# Patient Record
Sex: Female | Born: 1950 | Race: White | Hispanic: No | Smoking: Former smoker
Health system: Southern US, Community
[De-identification: ages and names within clinical notes are randomized; demographics above are authoritative.]

## PROBLEM LIST (undated history)

## (undated) DIAGNOSIS — C801 Malignant (primary) neoplasm, unspecified: Secondary | ICD-10-CM

## (undated) DIAGNOSIS — G2581 Restless legs syndrome: Secondary | ICD-10-CM

## (undated) DIAGNOSIS — T4145XA Adverse effect of unspecified anesthetic, initial encounter: Secondary | ICD-10-CM

## (undated) DIAGNOSIS — F32A Depression, unspecified: Secondary | ICD-10-CM

## (undated) DIAGNOSIS — J45909 Unspecified asthma, uncomplicated: Secondary | ICD-10-CM

## (undated) DIAGNOSIS — D649 Anemia, unspecified: Secondary | ICD-10-CM

## (undated) DIAGNOSIS — I10 Essential (primary) hypertension: Secondary | ICD-10-CM

## (undated) DIAGNOSIS — F419 Anxiety disorder, unspecified: Secondary | ICD-10-CM

## (undated) DIAGNOSIS — F329 Major depressive disorder, single episode, unspecified: Secondary | ICD-10-CM

## (undated) DIAGNOSIS — Z8781 Personal history of (healed) traumatic fracture: Secondary | ICD-10-CM

## (undated) DIAGNOSIS — K219 Gastro-esophageal reflux disease without esophagitis: Secondary | ICD-10-CM

## (undated) DIAGNOSIS — I499 Cardiac arrhythmia, unspecified: Secondary | ICD-10-CM

## (undated) DIAGNOSIS — J449 Chronic obstructive pulmonary disease, unspecified: Secondary | ICD-10-CM

## (undated) DIAGNOSIS — R0600 Dyspnea, unspecified: Secondary | ICD-10-CM

## (undated) DIAGNOSIS — E119 Type 2 diabetes mellitus without complications: Secondary | ICD-10-CM

## (undated) HISTORY — PX: COLONOSCOPY: SHX174

## (undated) HISTORY — DX: Unspecified asthma, uncomplicated: J45.909

## (undated) HISTORY — PX: NOSE SURGERY: SHX723

---
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
## 1975-08-04 HISTORY — PX: TUBAL LIGATION: SHX77

## 1988-08-03 DIAGNOSIS — T8859XA Other complications of anesthesia, initial encounter: Secondary | ICD-10-CM

## 1988-08-03 HISTORY — PX: BUNIONECTOMY: SHX129

## 1988-08-03 HISTORY — DX: Other complications of anesthesia, initial encounter: T88.59XA

## 2004-06-28 ENCOUNTER — Other Ambulatory Visit: Payer: Self-pay

## 2004-06-28 ENCOUNTER — Emergency Department: Payer: Self-pay | Admitting: Internal Medicine

## 2010-05-04 ENCOUNTER — Inpatient Hospital Stay: Payer: Self-pay | Admitting: Internal Medicine

## 2010-12-04 ENCOUNTER — Emergency Department: Payer: Self-pay | Admitting: Emergency Medicine

## 2011-04-26 DIAGNOSIS — Z720 Tobacco use: Secondary | ICD-10-CM | POA: Insufficient documentation

## 2011-04-26 DIAGNOSIS — J9600 Acute respiratory failure, unspecified whether with hypoxia or hypercapnia: Secondary | ICD-10-CM | POA: Insufficient documentation

## 2011-04-26 DIAGNOSIS — J441 Chronic obstructive pulmonary disease with (acute) exacerbation: Secondary | ICD-10-CM | POA: Insufficient documentation

## 2016-12-31 DIAGNOSIS — J449 Chronic obstructive pulmonary disease, unspecified: Secondary | ICD-10-CM | POA: Insufficient documentation

## 2016-12-31 DIAGNOSIS — Z87891 Personal history of nicotine dependence: Secondary | ICD-10-CM | POA: Insufficient documentation

## 2016-12-31 DIAGNOSIS — G2581 Restless legs syndrome: Secondary | ICD-10-CM | POA: Insufficient documentation

## 2017-01-01 ENCOUNTER — Other Ambulatory Visit: Payer: Self-pay | Admitting: Internal Medicine

## 2017-01-01 DIAGNOSIS — Z1231 Encounter for screening mammogram for malignant neoplasm of breast: Secondary | ICD-10-CM

## 2017-01-20 ENCOUNTER — Encounter (HOSPITAL_COMMUNITY): Payer: Self-pay

## 2017-01-20 ENCOUNTER — Ambulatory Visit
Admission: RE | Admit: 2017-01-20 | Discharge: 2017-01-20 | Disposition: A | Discharge status: Home / Self Care | Payer: BLUE CROSS/BLUE SHIELD | Source: Ambulatory Visit | Attending: Internal Medicine | Admitting: Internal Medicine

## 2017-01-20 DIAGNOSIS — Z1231 Encounter for screening mammogram for malignant neoplasm of breast: Secondary | ICD-10-CM | POA: Diagnosis not present

## 2017-01-20 HISTORY — DX: Malignant (primary) neoplasm, unspecified: C80.1

## 2017-01-27 ENCOUNTER — Inpatient Hospital Stay
Admission: RE | Admit: 2017-01-27 | Discharge: 2017-01-27 | Disposition: A | Discharge status: Home / Self Care | Payer: Self-pay | Source: Ambulatory Visit | Attending: *Deleted | Admitting: *Deleted

## 2017-01-27 ENCOUNTER — Other Ambulatory Visit: Payer: Self-pay | Admitting: *Deleted

## 2017-01-27 DIAGNOSIS — Z9289 Personal history of other medical treatment: Secondary | ICD-10-CM

## 2017-05-03 HISTORY — PX: YAG LASER APPLICATION: SHX6189

## 2017-08-03 ENCOUNTER — Emergency Department
Admission: EM | Admit: 2017-08-03 | Discharge: 2017-08-03 | Disposition: A | Discharge status: Home / Self Care | Payer: BLUE CROSS/BLUE SHIELD | Attending: Emergency Medicine | Admitting: Emergency Medicine

## 2017-08-03 ENCOUNTER — Emergency Department: Payer: BLUE CROSS/BLUE SHIELD

## 2017-08-03 ENCOUNTER — Other Ambulatory Visit: Payer: Self-pay

## 2017-08-03 ENCOUNTER — Encounter: Payer: Self-pay | Admitting: Emergency Medicine

## 2017-08-03 DIAGNOSIS — S52122A Displaced fracture of head of left radius, initial encounter for closed fracture: Secondary | ICD-10-CM | POA: Diagnosis not present

## 2017-08-03 DIAGNOSIS — W010XXA Fall on same level from slipping, tripping and stumbling without subsequent striking against object, initial encounter: Secondary | ICD-10-CM | POA: Diagnosis not present

## 2017-08-03 DIAGNOSIS — S4992XA Unspecified injury of left shoulder and upper arm, initial encounter: Secondary | ICD-10-CM | POA: Diagnosis present

## 2017-08-03 DIAGNOSIS — Y9341 Activity, dancing: Secondary | ICD-10-CM | POA: Diagnosis not present

## 2017-08-03 DIAGNOSIS — Y999 Unspecified external cause status: Secondary | ICD-10-CM | POA: Insufficient documentation

## 2017-08-03 DIAGNOSIS — Z87891 Personal history of nicotine dependence: Secondary | ICD-10-CM | POA: Diagnosis not present

## 2017-08-03 DIAGNOSIS — Y929 Unspecified place or not applicable: Secondary | ICD-10-CM | POA: Insufficient documentation

## 2017-08-03 MED ORDER — ONDANSETRON 8 MG PO TBDP
8.0000 mg | ORAL_TABLET | Freq: Once | ORAL | Status: AC
  Administered 2017-08-03: 8 mg via ORAL
  Filled 2017-08-03: qty 1

## 2017-08-03 MED ORDER — OXYCODONE-ACETAMINOPHEN 5-325 MG PO TABS
1.0000 | ORAL_TABLET | Freq: Once | ORAL | Status: AC
  Administered 2017-08-03: 1 via ORAL
  Filled 2017-08-03: qty 1

## 2017-08-03 MED ORDER — OXYCODONE-ACETAMINOPHEN 5-325 MG PO TABS: 1.0000 | ORAL_TABLET | Freq: Four times a day (QID) | ORAL | 0 refills | Status: DC | PRN

## 2017-08-03 NOTE — Discharge Instructions (Signed)
Keep arm in sling until evaluation by orthopedics clinic. Call tomorrow morning at 0 8:30 to schedule appointment.

## 2017-08-03 NOTE — Consult Note (Signed)
ORTHOPAEDIC CONSULTATION  REQUESTING PHYSICIAN: Mardee Postin, PA  Chief Complaint:   L elbow pain  History of Present Illness: Tanya Ryan is a 67 y.o. female with left elbow pain after fall yesterday while dancing. Patient stated pain increases with elbow extension. Pain improved with immobilization. She rates the pain as a 10 over 10. Patient described a pain as throbbing. She is left-handed. X-rays in ED show radial head fracture.    Past Medical History:  Diagnosis Date  . Cancer ([place])    skin   History reviewed. No pertinent surgical history. Social History   Socioeconomic History  . Marital status: Divorced    Spouse name: None  . Number of children: None  . Years of education: None  . Highest education level: None  Social Needs  . Financial resource strain: None  . Food insecurity - worry: None  . Food insecurity - inability: None  . Transportation needs - medical: None  . Transportation needs - non-medical: None  Occupational History  . None  Tobacco Use  . Smoking status: Former Research scientist (life sciences)  . Smokeless tobacco: Never Used  Substance and Sexual Activity  . Alcohol use: No    Frequency: Never  . Drug use: None  . Sexual activity: None  Other Topics Concern  . None  Social History Narrative  . None   Family History  Problem Relation Age of Onset  . Breast cancer Neg Hx    No Known Allergies Prior to Admission medications   Medication Sig Start Date End Date Taking? Authorizing Provider  oxyCODONE-acetaminophen (ROXICET) 5-325 MG tablet Take 1 tablet by mouth every 6 (six) hours as needed. 08/03/17 08/03/18  Sable Feil, PA-C   Dg Elbow Complete Left  Result Date: 08/03/2017 CLINICAL DATA:  Recent fall with elbow pain, initial encounter EXAM: LEFT ELBOW - COMPLETE 3+ VIEW COMPARISON:  None. FINDINGS: There is a fracture through the radial head and radial neck with displacement of the articular  surface of the radial head posteriorly. Joint effusion is noted. No other focal abnormality is seen. IMPRESSION: Fracture the proximal radius with displacement of the articular surface of the radial head posteriorly. Electronically Signed   By: Inez Catalina M.D.   On: 08/03/2017 13:33    Positive ROS: All other systems have been reviewed and were otherwise negative with the exception of those mentioned in the HPI and as above.  Physical Exam: BP (!) 167/60 (BP Location: Right Arm)   Pulse 81   Temp 98.6 F (37 C) (Oral)   Resp 16   Ht 5\' 3"  (1.6 m)   Wt 83.9 kg (185 lb)   SpO2 98%   BMI 32.77 kg/m  General:  Alert, no acute distress Psychiatric:  Patient is competent for consent with normal mood and affect   Cardiovascular:  No pedal edema, regular rate and rhythm Respiratory:  No wheezing, non-labored breathing GI:  Abdomen is soft and non-tender Skin:  No lesions in the area of chief complaint, no erythema Neurologic:  Sensation intact distally, CN grossly intact Lymphatic:  No axillary or cervical lymphadenopathy  Orthopedic Exam:  LUE: +ain/pin/u motor Silt r/u/m distr +rad pulse Exquisite tenderness about elbow diffusely Elbow RoM: 20-90 flex/ext, neutral supination, 80 pronation  X-rays:  As above: fracture of articular portion of radial head with posterior displacement of articular fragment  Assessment/Plan: 1. NWB LUE 2. Sling to LUE 3. Discussed her case with my partner, Dr. Roland Rack, who will plan to see her in the  office this week to discuss surgical options.     Leim Fabry   08/03/2017 3:20 PM

## 2017-08-03 NOTE — ED Triage Notes (Signed)
Was dancing last night and someone stepped on her shoe causing her to fall and injure her left arm.

## 2017-08-03 NOTE — ED Notes (Signed)
Pt reports that she was dancing and fell causing injury to left elbow last night - pt reports difficulty moving the arm and elbow

## 2017-08-03 NOTE — ED Provider Notes (Signed)
[facility] Emergency Department Provider Note   ____________________________________________   First MD Initiated Contact with Patient 08/03/17 1310     (approximate)  I have reviewed the triage vital signs and the nursing notes.   HISTORY  Chief Complaint Arm Injury    HPI Tanya Ryan is a 67 y.o. female patient complaining of left elbow pain secondary to a slip and fall yesterday while dancing. He stated pain is completely fine to her left elbow. Patient stated pain increases with extension of the forearm. She rates the pain as a 10 over 10. Patient described a pain as "throbbing". Patient has a make shift slingsupporting his left elbow.   Past Medical History:  Diagnosis Date  . Cancer ([place])    skin    There are no active problems to display for this patient.   History reviewed. No pertinent surgical history.  Prior to Admission medications   Medication Sig Start Date End Date Taking? Authorizing Provider  oxyCODONE-acetaminophen (ROXICET) 5-325 MG tablet Take 1 tablet by mouth every 6 (six) hours as needed. 08/03/17 08/03/18  Sable Feil, PA-C    Allergies Patient has no known allergies.  Family History  Problem Relation Age of Onset  . Breast cancer Neg Hx     Social History Social History   Tobacco Use  . Smoking status: Former Research scientist (life sciences)  . Smokeless tobacco: Never Used  Substance Use Topics  . Alcohol use: No    Frequency: Never  . Drug use: Not on file    Review of Systems Constitutional: No fever/chills Eyes: No visual changes. ENT: No sore throat. Cardiovascular: Denies chest pain. Respiratory: Denies shortness of breath. Gastrointestinal: No abdominal pain.  No nausea, no vomiting.  No diarrhea.  No constipation. Genitourinary: Negative for dysuria. Musculoskeletal: Negative for back pain. Skin: Negative for rash. Neurological: Negative for headaches, focal weakness or  numbness.   ____________________________________________   PHYSICAL EXAM:  VITAL SIGNS: ED Triage Vitals  Enc Vitals Group     BP 08/03/17 1236 (!) 167/60     Pulse Rate 08/03/17 1236 81     Resp 08/03/17 1236 16     Temp 08/03/17 1236 98.6 F (37 C)     Temp Source 08/03/17 1236 Oral     SpO2 08/03/17 1236 98 %     Weight 08/03/17 1236 185 lb (83.9 kg)     Height 08/03/17 1236 5\' 3"  (1.6 m)     Head Circumference --      Peak Flow --      Pain Score 08/03/17 1217 10     Pain Loc --      Pain Edu? --      Excl. in [place]? --    Constitutional: Alert and oriented. Well appearing and in no acute distress. Neck: No stridor. Hematological/Lymphatic/Immunilogical: No cervical lymphadenopathy. Cardiovascular: Normal rate, regular rhythm. Grossly normal heart sounds.  Good peripheral circulation. Elevated blood pressure Respiratory: Normal respiratory effort.  No retractions. Lungs CTAB. Musculoskeletal: No obvious deformity to the left elbow. There is mild effusion. Patient has moderate guarding palpation of the radial head.. Neurologic:  Normal speech and language. No gross focal neurologic deficits are appreciated. No gait instability. Skin:  Skin is warm, dry and intact. No rash noted. Psychiatric: Mood and affect are normal. Speech and behavior are normal.  ____________________________________________   LABS (all labs ordered are listed, but only abnormal results are displayed)  Labs Reviewed - No data to display ____________________________________________  EKG  ____________________________________________  RADIOLOGY  Dg Elbow Complete Left  Result Date: 08/03/2017 CLINICAL DATA:  Recent fall with elbow pain, initial encounter EXAM: LEFT ELBOW - COMPLETE 3+ VIEW COMPARISON:  None. FINDINGS: There is a fracture through the radial head and radial neck with displacement of the articular surface of the radial head posteriorly. Joint effusion is noted. No other focal  abnormality is seen. IMPRESSION: Fracture the proximal radius with displacement of the articular surface of the radial head posteriorly. Electronically Signed   By: Inez Catalina M.D.   On: 08/03/2017 13:33    ____________________________________________   PROCEDURES  Procedure(s) performed: None  Procedures  Critical Care performed: No  ____________________________________________   INITIAL IMPRESSION / ASSESSMENT AND PLAN / ED COURSE  As part of my medical decision making, I reviewed the following data within the electronic MEDICAL RECORD NUMBER    Displaced radial head fracture of the left upper extremity. Discussed patient with Dr. Posey Pronto and patient will follow-up in the orthopedic department by calling for an appointment tomorrow morning. Patient placed and a sling prior to departure. Status post patient of the sling patient remained neurovascularly intact was able to move the wrist and fingers.      ____________________________________________   FINAL CLINICAL IMPRESSION(S) / ED DIAGNOSES  Final diagnoses:  Closed displaced fracture of head of left radius, initial encounter     ED Discharge Orders        Ordered    oxyCODONE-acetaminophen (ROXICET) 5-325 MG tablet  Every 6 hours PRN     08/03/17 1446       Note:  This document was prepared using Dragon voice recognition software and may include unintentional dictation errors.    Sable Feil, PA-C 08/03/17 1450    Schuyler Amor, MD 08/03/17 [phone]

## 2017-08-06 DIAGNOSIS — S52122A Displaced fracture of head of left radius, initial encounter for closed fracture: Secondary | ICD-10-CM | POA: Insufficient documentation

## 2017-08-09 ENCOUNTER — Encounter
Admission: RE | Admit: 2017-08-09 | Discharge: 2017-08-09 | Disposition: A | Discharge status: Home / Self Care | Payer: BLUE CROSS/BLUE SHIELD | Source: Ambulatory Visit | Attending: Surgery | Admitting: Surgery

## 2017-08-09 ENCOUNTER — Other Ambulatory Visit: Payer: Self-pay

## 2017-08-09 DIAGNOSIS — K219 Gastro-esophageal reflux disease without esophagitis: Secondary | ICD-10-CM | POA: Diagnosis not present

## 2017-08-09 DIAGNOSIS — Z7984 Long term (current) use of oral hypoglycemic drugs: Secondary | ICD-10-CM | POA: Diagnosis not present

## 2017-08-09 DIAGNOSIS — E119 Type 2 diabetes mellitus without complications: Secondary | ICD-10-CM | POA: Diagnosis not present

## 2017-08-09 DIAGNOSIS — F411 Generalized anxiety disorder: Secondary | ICD-10-CM | POA: Diagnosis not present

## 2017-08-09 DIAGNOSIS — Y9341 Activity, dancing: Secondary | ICD-10-CM | POA: Diagnosis not present

## 2017-08-09 DIAGNOSIS — Z85828 Personal history of other malignant neoplasm of skin: Secondary | ICD-10-CM | POA: Diagnosis not present

## 2017-08-09 DIAGNOSIS — S52122A Displaced fracture of head of left radius, initial encounter for closed fracture: Secondary | ICD-10-CM | POA: Diagnosis present

## 2017-08-09 DIAGNOSIS — W500XXA Accidental hit or strike by another person, initial encounter: Secondary | ICD-10-CM | POA: Diagnosis not present

## 2017-08-09 DIAGNOSIS — G2581 Restless legs syndrome: Secondary | ICD-10-CM | POA: Diagnosis not present

## 2017-08-09 DIAGNOSIS — Z79899 Other long term (current) drug therapy: Secondary | ICD-10-CM | POA: Diagnosis not present

## 2017-08-09 DIAGNOSIS — Z888 Allergy status to other drugs, medicaments and biological substances status: Secondary | ICD-10-CM | POA: Diagnosis not present

## 2017-08-09 DIAGNOSIS — J449 Chronic obstructive pulmonary disease, unspecified: Secondary | ICD-10-CM | POA: Diagnosis not present

## 2017-08-09 DIAGNOSIS — Y998 Other external cause status: Secondary | ICD-10-CM | POA: Diagnosis not present

## 2017-08-09 HISTORY — DX: Anxiety disorder, unspecified: F41.9

## 2017-08-09 HISTORY — DX: Personal history of (healed) traumatic fracture: Z87.81

## 2017-08-09 HISTORY — DX: Type 2 diabetes mellitus without complications: E11.9

## 2017-08-09 HISTORY — DX: Restless legs syndrome: G25.81

## 2017-08-09 HISTORY — DX: Chronic obstructive pulmonary disease, unspecified: J44.9

## 2017-08-09 HISTORY — DX: Gastro-esophageal reflux disease without esophagitis: K21.9

## 2017-08-09 LAB — CBC
HCT: 40.3 % (ref 35.0–47.0)
Hemoglobin: 13.2 g/dL (ref 12.0–16.0)
MCH: 29 pg (ref 26.0–34.0)
MCHC: 32.8 g/dL (ref 32.0–36.0)
MCV: 88.5 fL (ref 80.0–100.0)
PLATELETS: 281 10*3/uL (ref 150–440)
RBC: 4.55 MIL/uL (ref 3.80–5.20)
RDW: 13.7 % (ref 11.5–14.5)
WBC: 9.7 10*3/uL (ref 3.6–11.0)

## 2017-08-09 LAB — BASIC METABOLIC PANEL
ANION GAP: 9 (ref 5–15)
BUN: 18 mg/dL (ref 6–20)
CO2: 27 mmol/L (ref 22–32)
Calcium: 9.6 mg/dL (ref 8.9–10.3)
Chloride: 105 mmol/L (ref 101–111)
Creatinine, Ser: 0.73 mg/dL (ref 0.44–1.00)
Glucose, Bld: 122 mg/dL — ABNORMAL HIGH (ref 65–99)
Potassium: 3.9 mmol/L (ref 3.5–5.1)
SODIUM: 141 mmol/L (ref 135–145)

## 2017-08-09 NOTE — Patient Instructions (Addendum)
Your procedure is scheduled on: Tuesday, January8, 2019  Report to [facility] AT 1:30 PM  Remember: Instructions that are not followed completely may result in serious medical risk, up to and including death, or upon the discretion of your surgeon and anesthesiologist your surgery may need to be rescheduled.     _X__ 1. Do not eat food after midnight the night before your procedure.                 No gum chewing or hard candies. You may drink clear liquids up to 2 hours                 before you are scheduled to arrive for your surgery- DO not drink clear                 liquids within 2 hours of the start of your surgery.                 Clear Liquids include:  water, apple juice without pulp, clear carbohydrate                 drink such as Clearfast of [place], Black Coffee or Tea (Do not add                 anything to coffee or tea).     _X__ 2.  No Alcohol for 24 hours before or after surgery.   _X__ 3.  Do Not Smoke or use e-cigarettes For 24 Hours Prior to Your Surgery.                 Do not use any chewable tobacco products for at least 6 hours prior to                 surgery.  ____  4.  Bring all medications with you on the day of surgery if instructed.   ____  5.  Notify your doctor if there is any change in your medical condition      (cold, fever, infections).     Do not wear jewelry, make-up, hairpins, clips or nail polish. Do not wear lotions, powders, or perfumes. You may wear deodorant. Do not shave 48 hours prior to surgery. Men may shave face and neck. Do not bring valuables to the hospital.    [facility] is not responsible for any belongings or valuables.  Contacts, dentures or bridgework may not be worn into surgery. Leave your suitcase in the car. After surgery it may be brought to your room. For patients admitted to the hospital, discharge time is determined by your treatment team.   Patients  discharged the day of surgery will not be allowed to drive home.   Please read over the following fact sheets that you were given:   PREPARING FOR SURGERY    ____ Take these medicines the morning of surgery with A SIP OF WATER:    1. ADVAIR  2.  XANAX  3.  PERCOCET, IF NEED  4.  ZOLOFT  5.  PRED FORTE DROPS  6.  CAN USE CLOBEX SHAMPOO             7.  PRILOSEC             8. LORATADINE  ____ Fleet Enema (as directed)   __X__ Use CHG Soap as directed  __X_ Use inhalers on the day of surgery  ____ Stop metformin 2 days  prior to surgery    ____ Take 1/2 of usual insulin dose the night before surgery. No insulin the morning          of surgery.   __X__ Stop ASPIRIN AS OF TODAY   __X__ Stop ANTIINFLAMMATORIES AS OF TODAY. THIS INCLUDES IBUPROFEN / MOTRIN / ADVIL / ALEVE / GOODIES POWDER   __X__ Stop supplements until after surgery.    ____ Bring C-Pap to the hospital.   DO NOT TAKE JANUVIA OR PHENTERMINE IN THE MORNING BEFORE SURGERY.  DO NOT TAKE ANY FISH OIL OR SUPPLEMENTS IN THE MORNING.  YOU MAY DRINK CLEAR LIQUIDS UP UNTIL 11:30 AM

## 2017-08-09 NOTE — Pre-Procedure Instructions (Signed)
Component Name Value Ref Range  Ventricular Rate 117 BPM  Atrial Rate 117 BPM  P-R Interval 138 ms  QRS Duration 136 ms  Q-T Interval 374 ms  QTC Calculation (Bezet) 521 ms  Calculated P Axis 73 degrees  Calculated R Axis 1 degrees  Calculated T Axis 107 degrees  Diagnosis Sinus tachycardia Left bundle branch block Confirmed by Graylon Gunning [id number]) on 06/03/2011 9:58:30 PM    Status Results Details   Encounter Summary    Current EKG with Left Bundle Branch block(not confirmed as of now). No other recent EKGs' to compare to EKG from 08/09/2017

## 2017-08-10 ENCOUNTER — Ambulatory Visit: Payer: BLUE CROSS/BLUE SHIELD | Admitting: Anesthesiology

## 2017-08-10 ENCOUNTER — Encounter
Admission: RE | Disposition: A | Discharge status: Home / Self Care | Payer: Self-pay | Source: Ambulatory Visit | Attending: Surgery

## 2017-08-10 ENCOUNTER — Ambulatory Visit
Admission: RE | Admit: 2017-08-10 | Discharge: 2017-08-10 | Disposition: A | Discharge status: Home / Self Care | Payer: BLUE CROSS/BLUE SHIELD | Source: Ambulatory Visit | Attending: Surgery | Admitting: Surgery

## 2017-08-10 DIAGNOSIS — Z888 Allergy status to other drugs, medicaments and biological substances status: Secondary | ICD-10-CM | POA: Insufficient documentation

## 2017-08-10 DIAGNOSIS — S52122A Displaced fracture of head of left radius, initial encounter for closed fracture: Secondary | ICD-10-CM | POA: Insufficient documentation

## 2017-08-10 DIAGNOSIS — E119 Type 2 diabetes mellitus without complications: Secondary | ICD-10-CM | POA: Insufficient documentation

## 2017-08-10 DIAGNOSIS — Z85828 Personal history of other malignant neoplasm of skin: Secondary | ICD-10-CM | POA: Insufficient documentation

## 2017-08-10 DIAGNOSIS — F411 Generalized anxiety disorder: Secondary | ICD-10-CM | POA: Insufficient documentation

## 2017-08-10 DIAGNOSIS — Z79899 Other long term (current) drug therapy: Secondary | ICD-10-CM | POA: Insufficient documentation

## 2017-08-10 DIAGNOSIS — W500XXA Accidental hit or strike by another person, initial encounter: Secondary | ICD-10-CM | POA: Insufficient documentation

## 2017-08-10 DIAGNOSIS — Z7984 Long term (current) use of oral hypoglycemic drugs: Secondary | ICD-10-CM | POA: Insufficient documentation

## 2017-08-10 DIAGNOSIS — Y9341 Activity, dancing: Secondary | ICD-10-CM | POA: Insufficient documentation

## 2017-08-10 DIAGNOSIS — J449 Chronic obstructive pulmonary disease, unspecified: Secondary | ICD-10-CM | POA: Insufficient documentation

## 2017-08-10 DIAGNOSIS — Y998 Other external cause status: Secondary | ICD-10-CM | POA: Insufficient documentation

## 2017-08-10 DIAGNOSIS — G2581 Restless legs syndrome: Secondary | ICD-10-CM | POA: Insufficient documentation

## 2017-08-10 DIAGNOSIS — K219 Gastro-esophageal reflux disease without esophagitis: Secondary | ICD-10-CM | POA: Insufficient documentation

## 2017-08-10 HISTORY — PX: RADIAL HEAD ARTHROPLASTY: SHX6044

## 2017-08-10 LAB — GLUCOSE, CAPILLARY
Glucose-Capillary: 115 mg/dL — ABNORMAL HIGH (ref 65–99)
Glucose-Capillary: 169 mg/dL — ABNORMAL HIGH (ref 65–99)

## 2017-08-10 SURGERY — ARTHROPLASTY, RADIUS, HEAD
Anesthesia: General | Site: Elbow | Laterality: Left | Wound class: Clean

## 2017-08-10 MED ORDER — FENTANYL CITRATE (PF) 100 MCG/2ML IJ SOLN
INTRAMUSCULAR | Status: AC
  Filled 2017-08-10: qty 2

## 2017-08-10 MED ORDER — CEFAZOLIN SODIUM-DEXTROSE 2-4 GM/100ML-% IV SOLN
INTRAVENOUS | Status: AC
  Filled 2017-08-10: qty 100

## 2017-08-10 MED ORDER — MIDAZOLAM HCL 2 MG/2ML IJ SOLN
INTRAMUSCULAR | Status: AC
  Filled 2017-08-10: qty 2

## 2017-08-10 MED ORDER — CEFAZOLIN SODIUM-DEXTROSE 2-4 GM/100ML-% IV SOLN
2.0000 g | Freq: Once | INTRAVENOUS | Status: AC
  Administered 2017-08-10: 2 g via INTRAVENOUS

## 2017-08-10 MED ORDER — BUPIVACAINE HCL (PF) 0.5 % IJ SOLN
INTRAMUSCULAR | Status: AC
  Filled 2017-08-10: qty 30

## 2017-08-10 MED ORDER — ONDANSETRON HCL 4 MG/2ML IJ SOLN
INTRAMUSCULAR | Status: AC
  Filled 2017-08-10: qty 2

## 2017-08-10 MED ORDER — ACETAMINOPHEN 10 MG/ML IV SOLN
INTRAVENOUS | Status: AC
  Filled 2017-08-10: qty 100

## 2017-08-10 MED ORDER — ONDANSETRON HCL 4 MG PO TABS: 4.0000 mg | ORAL_TABLET | Freq: Four times a day (QID) | ORAL | Status: DC | PRN

## 2017-08-10 MED ORDER — LIDOCAINE HCL (PF) 2 % IJ SOLN
INTRAMUSCULAR | Status: AC
  Filled 2017-08-10: qty 10

## 2017-08-10 MED ORDER — PROPOFOL 10 MG/ML IV BOLUS
INTRAVENOUS | Status: DC | PRN
  Administered 2017-08-10: 150 mg via INTRAVENOUS

## 2017-08-10 MED ORDER — OXYCODONE HCL 5 MG PO TABS: 5.0000 mg | ORAL_TABLET | ORAL | 0 refills | Status: DC | PRN

## 2017-08-10 MED ORDER — METOCLOPRAMIDE HCL 10 MG PO TABS: 5.0000 mg | ORAL_TABLET | Freq: Three times a day (TID) | ORAL | Status: DC | PRN

## 2017-08-10 MED ORDER — FENTANYL CITRATE (PF) 100 MCG/2ML IJ SOLN
INTRAMUSCULAR | Status: DC | PRN
  Administered 2017-08-10 (×4): 50 ug via INTRAVENOUS

## 2017-08-10 MED ORDER — SODIUM CHLORIDE 0.9 % IV SOLN
INTRAVENOUS | Status: DC
  Administered 2017-08-10: 14:00:00 via INTRAVENOUS

## 2017-08-10 MED ORDER — OXYCODONE HCL 5 MG PO TABS: 5.0000 mg | ORAL_TABLET | ORAL | Status: DC | PRN

## 2017-08-10 MED ORDER — ONDANSETRON HCL 4 MG/2ML IJ SOLN
4.0000 mg | Freq: Once | INTRAMUSCULAR | Status: DC | PRN
Start: 2017-08-10 — End: 2017-08-10

## 2017-08-10 MED ORDER — FAMOTIDINE 20 MG PO TABS
ORAL_TABLET | ORAL | Status: AC
  Administered 2017-08-10: 20 mg via ORAL
  Filled 2017-08-10: qty 1

## 2017-08-10 MED ORDER — PROPOFOL 10 MG/ML IV BOLUS
INTRAVENOUS | Status: AC
  Filled 2017-08-10: qty 20

## 2017-08-10 MED ORDER — OXYCODONE HCL 5 MG PO TABS
5.0000 mg | ORAL_TABLET | ORAL | Status: DC | PRN
  Administered 2017-08-10: 5 mg via ORAL
  Filled 2017-08-10: qty 1

## 2017-08-10 MED ORDER — METOCLOPRAMIDE HCL 5 MG/ML IJ SOLN: 5.0000 mg | Freq: Three times a day (TID) | INTRAMUSCULAR | Status: DC | PRN

## 2017-08-10 MED ORDER — LIDOCAINE HCL (CARDIAC) 20 MG/ML IV SOLN
INTRAVENOUS | Status: DC | PRN
  Administered 2017-08-10: 100 mg via INTRAVENOUS

## 2017-08-10 MED ORDER — FAMOTIDINE 20 MG PO TABS
20.0000 mg | ORAL_TABLET | Freq: Once | ORAL | Status: AC
  Administered 2017-08-10: 20 mg via ORAL

## 2017-08-10 MED ORDER — GLYCOPYRROLATE 0.2 MG/ML IJ SOLN
INTRAMUSCULAR | Status: DC | PRN
  Administered 2017-08-10: 0.2 mg via INTRAVENOUS

## 2017-08-10 MED ORDER — OXYCODONE HCL 5 MG PO TABS
ORAL_TABLET | ORAL | Status: AC
  Administered 2017-08-10: 5 mg via ORAL
  Filled 2017-08-10: qty 1

## 2017-08-10 MED ORDER — HYDROMORPHONE HCL 1 MG/ML IJ SOLN
INTRAMUSCULAR | Status: DC | PRN
  Administered 2017-08-10 (×2): 0.5 mg via INTRAVENOUS

## 2017-08-10 MED ORDER — ACETAMINOPHEN 10 MG/ML IV SOLN
INTRAVENOUS | Status: DC | PRN
  Administered 2017-08-10: 1000 mg via INTRAVENOUS

## 2017-08-10 MED ORDER — NEOMYCIN-POLYMYXIN B GU 40-200000 IR SOLN
Status: DC | PRN
  Administered 2017-08-10: 2 mL

## 2017-08-10 MED ORDER — ONDANSETRON HCL 4 MG/2ML IJ SOLN: 4.0000 mg | Freq: Four times a day (QID) | INTRAMUSCULAR | Status: DC | PRN

## 2017-08-10 MED ORDER — BUPIVACAINE HCL (PF) 0.5 % IJ SOLN
INTRAMUSCULAR | Status: DC | PRN
  Administered 2017-08-10: 30 mL

## 2017-08-10 MED ORDER — DEXAMETHASONE SODIUM PHOSPHATE 10 MG/ML IJ SOLN
INTRAMUSCULAR | Status: AC
  Filled 2017-08-10: qty 1

## 2017-08-10 MED ORDER — DEXAMETHASONE SODIUM PHOSPHATE 10 MG/ML IJ SOLN
INTRAMUSCULAR | Status: DC | PRN
  Administered 2017-08-10: 10 mg via INTRAVENOUS

## 2017-08-10 MED ORDER — HYDROMORPHONE HCL 1 MG/ML IJ SOLN
INTRAMUSCULAR | Status: AC
  Filled 2017-08-10: qty 1

## 2017-08-10 MED ORDER — FENTANYL CITRATE (PF) 100 MCG/2ML IJ SOLN: 25.0000 ug | INTRAMUSCULAR | Status: DC | PRN

## 2017-08-10 MED ORDER — MIDAZOLAM HCL 2 MG/2ML IJ SOLN
INTRAMUSCULAR | Status: DC | PRN
  Administered 2017-08-10: 2 mg via INTRAVENOUS

## 2017-08-10 MED ORDER — POTASSIUM CHLORIDE IN NACL 20-0.9 MEQ/L-% IV SOLN: INTRAVENOUS | Status: DC

## 2017-08-10 MED ORDER — DEXMEDETOMIDINE HCL 200 MCG/2ML IV SOLN
INTRAVENOUS | Status: DC | PRN
  Administered 2017-08-10: 20 ug via INTRAVENOUS

## 2017-08-10 MED ORDER — ONDANSETRON HCL 4 MG/2ML IJ SOLN
INTRAMUSCULAR | Status: DC | PRN
  Administered 2017-08-10: 4 mg via INTRAVENOUS

## 2017-08-10 MED ORDER — GLYCOPYRROLATE 0.2 MG/ML IJ SOLN
INTRAMUSCULAR | Status: AC
  Filled 2017-08-10: qty 1

## 2017-08-10 SURGICAL SUPPLY — 47 items
BANDAGE ACE 4X5 VEL STRL LF (GAUZE/BANDAGES/DRESSINGS) ×6 IMPLANT
BLADE MED AGGRESSIVE (BLADE) ×3 IMPLANT
BLADE SURG SZ10 CARB STEEL (BLADE) ×3 IMPLANT
BNDG COHESIVE 4X5 TAN STRL (GAUZE/BANDAGES/DRESSINGS) ×3 IMPLANT
BNDG ESMARK 4X12 TAN STRL LF (GAUZE/BANDAGES/DRESSINGS) ×3 IMPLANT
CANISTER SUCT 1200ML W/VALVE (MISCELLANEOUS) ×3 IMPLANT
CHLORAPREP W/TINT 26ML (MISCELLANEOUS) ×3 IMPLANT
CORD BIP STRL DISP 12FT (MISCELLANEOUS) IMPLANT
CUFF TOURN 18 STER (MISCELLANEOUS) ×3 IMPLANT
CUFF TOURN 24 STER (MISCELLANEOUS) IMPLANT
DRAPE FLUOR MINI C-ARM 54X84 (DRAPES) ×3 IMPLANT
DRAPE SURG 17X11 SM STRL (DRAPES) ×3 IMPLANT
ELECT REM PT RETURN 9FT ADLT (ELECTROSURGICAL) ×3
ELECTRODE REM PT RTRN 9FT ADLT (ELECTROSURGICAL) ×1 IMPLANT
FORCEPS JEWEL BIP 4-3/4 STR (INSTRUMENTS) IMPLANT
GLOVE BIO SURGEON STRL SZ8 (GLOVE) ×9 IMPLANT
GLOVE INDICATOR 8.0 STRL GRN (GLOVE) ×3 IMPLANT
GOWN STRL REUS W/ TWL LRG LVL3 (GOWN DISPOSABLE) ×2 IMPLANT
GOWN STRL REUS W/ TWL XL LVL3 (GOWN DISPOSABLE) ×1 IMPLANT
GOWN STRL REUS W/TWL LRG LVL3 (GOWN DISPOSABLE) ×4
GOWN STRL REUS W/TWL XL LVL3 (GOWN DISPOSABLE) ×2
HEAD RADIAL 12X22 (Head) ×3 IMPLANT
IMMOBILIZER SHDR LG LX 900803 (SOFTGOODS) ×3 IMPLANT
IMPL STEM W/SCREW 7X26MM (Stem) ×1 IMPLANT
IMPLANT STEM W/SCREW 7X26MM (Stem) ×3 IMPLANT
KIT RM TURNOVER STRD PROC AR (KITS) ×3 IMPLANT
LABEL OR SOLS (LABEL) ×3 IMPLANT
LOOP RED MAXI  1X406MM (MISCELLANEOUS) ×2
LOOP VESSEL MAXI 1X406 RED (MISCELLANEOUS) ×1 IMPLANT
NDL SAFETY ECLIPSE 18X1.5 (NEEDLE) ×1 IMPLANT
NEEDLE HYPO 18GX1.5 SHARP (NEEDLE) ×2
NS IRRIG 500ML POUR BTL (IV SOLUTION) ×3 IMPLANT
PACK EXTREMITY ARMC (MISCELLANEOUS) ×3 IMPLANT
PAD CAST CTTN 4X4 STRL (SOFTGOODS) ×2 IMPLANT
PADDING CAST COTTON 4X4 STRL (SOFTGOODS) ×4
SPONGE LAP 18X18 5 PK (GAUZE/BANDAGES/DRESSINGS) ×3 IMPLANT
STAPLER SKIN PROX 35W (STAPLE) ×3 IMPLANT
STOCKINETTE IMPERVIOUS 9X36 MD (GAUZE/BANDAGES/DRESSINGS) ×3 IMPLANT
SUT PROLENE 4 0 PS 2 18 (SUTURE) ×3 IMPLANT
SUT VIC AB 0 CT2 27 (SUTURE) ×3 IMPLANT
SUT VIC AB 2-0 CT1 (SUTURE) ×3 IMPLANT
SUT VIC AB 3-0 SH 27 (SUTURE) ×2
SUT VIC AB 3-0 SH 27X BRD (SUTURE) ×1 IMPLANT
SUT VIC AB 4-0 SH 27 (SUTURE) ×2
SUT VIC AB 4-0 SH 27XANBCTRL (SUTURE) ×1 IMPLANT
SUT VICRYL+ 3-0 36IN CT-1 (SUTURE) ×6 IMPLANT
SYR 10ML LL (SYRINGE) ×3 IMPLANT

## 2017-08-10 NOTE — Anesthesia Post-op Follow-up Note (Signed)
Anesthesia QCDR form completed.        

## 2017-08-10 NOTE — Anesthesia Preprocedure Evaluation (Signed)
Anesthesia Evaluation  Patient identified by MRN, date of birth, ID band Patient awake    Reviewed: Allergy & Precautions, H&P , NPO status , Patient's Chart, lab work & pertinent test results, reviewed documented beta blocker date and time   History of Anesthesia Complications Negative for: history of anesthetic complications  Airway Mallampati: III  TM Distance: >3 FB Neck ROM: full    Dental  (+) Caps, Dental Advidsory Given Permanent bridge on top left:   Pulmonary shortness of breath and with exertion, COPD,  COPD inhaler, neg recent URI, former smoker,           Cardiovascular Exercise Tolerance: Good negative cardio ROS       Neuro/Psych PSYCHIATRIC DISORDERS negative neurological ROS     GI/Hepatic Neg liver ROS, GERD  ,  Endo/Other  diabetes  Renal/GU negative Renal ROS  negative genitourinary   Musculoskeletal   Abdominal   Peds  Hematology negative hematology ROS (+)   Anesthesia Other Findings Past Medical History: No date: Anxiety No date: Cancer [facility])     Comment:  skin No date: COPD (chronic obstructive pulmonary disease) (HCC) No date: Diabetes mellitus without complication (HCC) No date: GERD (gastroesophageal reflux disease) 1970: History of broken nose No date: Restless leg syndrome   Reproductive/Obstetrics negative OB ROS                             Anesthesia Physical Anesthesia Plan  ASA: III  Anesthesia Plan: General   Post-op Pain Management:    Induction: Intravenous  PONV Risk Score and Plan: 3 and Ondansetron and Dexamethasone  Airway Management Planned: LMA  Additional Equipment:   Intra-op Plan:   Post-operative Plan: Extubation in OR  Informed Consent: I have reviewed the patients History and Physical, chart, labs and discussed the procedure including the risks, benefits and alternatives for the proposed anesthesia with the patient or  authorized representative who has indicated his/her understanding and acceptance.   Dental Advisory Given  Plan Discussed with: Anesthesiologist, CRNA and Surgeon  Anesthesia Plan Comments:         Anesthesia Quick Evaluation

## 2017-08-10 NOTE — Transfer of Care (Signed)
Immediate Anesthesia Transfer of Care Note  Patient: Tanya Ryan  Procedure(s) Performed: RADIAL HEAD ARTHROPLASTY (Left Elbow)  Patient Location: PACU  Anesthesia Type:General  Level of Consciousness: sedated and responds to stimulation  Airway & Oxygen Therapy: Patient Spontanous Breathing and Patient connected to face mask oxygen  Post-op Assessment: Report given to RN and Post -op Vital signs reviewed and stable  Post vital signs: Reviewed and stable  Last Vitals:  Vitals:   08/10/17 1337 08/10/17 1753  BP: (!) 156/85 (!) 102/46  Pulse: 67 70  Resp: 18 15  Temp: 36.8 C 37.8 C  SpO2: 95% 99%    Last Pain:  Vitals:   08/10/17 1337  TempSrc: Oral         Complications: No apparent anesthesia complications

## 2017-08-10 NOTE — Anesthesia Procedure Notes (Signed)
Procedure Name: LMA Insertion Date/Time: 08/10/2017 3:38 PM Performed by: Jonna Clark, CRNA Pre-anesthesia Checklist: Patient identified, Patient being monitored, Timeout performed, Emergency Drugs available and Suction available Patient Re-evaluated:Patient Re-evaluated prior to induction Oxygen Delivery Method: Circle system utilized Preoxygenation: Pre-oxygenation with 100% oxygen Induction Type: IV induction Ventilation: Mask ventilation without difficulty LMA: LMA inserted LMA Size: 4.0 Tube type: Oral Number of attempts: 1 Placement Confirmation: positive ETCO2 and breath sounds checked- equal and bilateral Tube secured with: Tape Dental Injury: Teeth and Oropharynx as per pre-operative assessment

## 2017-08-10 NOTE — Anesthesia Postprocedure Evaluation (Signed)
Anesthesia Post Note  Patient: Tanya Ryan  Procedure(s) Performed: RADIAL HEAD ARTHROPLASTY (Left Elbow)  Patient location during evaluation: PACU Anesthesia Type: General Level of consciousness: awake and alert Pain management: pain level controlled Vital Signs Assessment: post-procedure vital signs reviewed and stable Respiratory status: spontaneous breathing, nonlabored ventilation, respiratory function stable and patient connected to nasal cannula oxygen Cardiovascular status: blood pressure returned to baseline and stable Postop Assessment: no apparent nausea or vomiting Anesthetic complications: no     Last Vitals:  Vitals:   08/10/17 1850 08/10/17 1901  BP: (!) 144/60 (!) 147/56  Pulse: 94 85  Resp: (!) 9 18  Temp:  37.2 C  SpO2: 92% 93%    Last Pain:  Vitals:   08/10/17 1901  TempSrc: Temporal  PainSc: [name]

## 2017-08-10 NOTE — Discharge Instructions (Addendum)
AMBULATORY SURGERY  DISCHARGE INSTRUCTIONS   1) The drugs that you were given will stay in your system until tomorrow so for the next 24 hours you should not:  A) Drive an automobile B) Make any legal decisions C) Drink any alcoholic beverage   2) You may resume regular meals tomorrow.  Today it is better to start with liquids and gradually work up to solid foods.  You may eat anything you prefer, but it is better to start with liquids, then soup and crackers, and gradually work up to solid foods.   3) Please notify your doctor immediately if you have any unusual bleeding, trouble breathing, redness and pain at the surgery site, drainage, fever, or pain not relieved by medication. 4)   5) Your post-operative visit with Dr.                                     is: Date:                        Time:    Please call to schedule your post-operative visit.  6) Additional Instructions:      Keep splint dry and intact. Keep hand elevated above heart level. Apply ice to affected area frequently. Take Aleve 2 tablets BID with meals for 7-10 days, then as necessary. Take pain medication as prescribed or ES Tylenol when needed.  Return for follow-up in 10-14 days or as scheduled.

## 2017-08-10 NOTE — H&P (Signed)
Paper H&P to be scanned into permanent record. H&P reviewed and patient re-examined. No changes. 

## 2017-08-10 NOTE — Op Note (Signed)
08/10/2017  6:05 PM  Patient:   Tanya Ryan  Pre-Op Diagnosis:   Displaced radial head fracture, left elbow.  Post-Op Diagnosis:   Same.  Procedure:   Radial head arthroplasty, left elbow.  Surgeon:   Pascal Lux, MD  Assistant:   Clearnce Sorrel, PA-S  Anesthesia:   General LMA  Findings:   As above.  Complications:   None  EBL:   10 cc  Fluids:   600 cc crystalloid  TT:   94 minutes at 250 mmHg  Drains:   None  Closure:   Staples  Implants:   Biomet ExploR 7 mm radial stem with 12 x 22 mm radial head implant  Brief Clinical Note:   The patient is a 67 year old female who sustained the above-noted injury when on New Year's Eve. She presented to the emergency room the following day where x-rays demonstrated a displaced radial head fracture. The patient presents at this time for definitive management of this injury.  Procedure:   The patient was brought into the operating room and lain in the supine position. After adequate general laryngal mask anesthesia was obtained, the patient's left upper extremity was prepped with ChloraPrep solution before being draped sterilely. Preoperative antibiotics were administered. A timeout was performed to verify the appropriate surgical site before a sterile tourniquet was applied. The limb was exsanguinated with an Esmarch and the tourniquet inflated to 250 mmHg. The elbow was assessed using FluoroScan imaging. There appeared to be mild opening medially with valgus stressing, but no opening laterally to varus stressing. An approximately 4-5 inch curvilinear incision was made over the posterolateral aspect of the elbow. The incision was carried down through the subcutaneous tissues to expose the fascia. The anterior flap was elevated sufficiently to expose the common extensor origin. The extensor digitorum communis tendon was identified and incised longitudinally to expose the annular ligament. The annular ligament was released to expose the  radial head fracture. Some dissection was carried out anteriorly to elevate the capsular tissues off the lateral aspect of the lateral epicondylar region to improve visualization. The radial head fragment was manipulated and removed. It was taken to the back table where it was sized and found to be best replicated by a 22 mm implant. The radial neck was cut with a micro-oscillating saw to remove approximately 14 mm of the radial head and neck before the rasp was used to smooth out the end of the radial neck before the proximal radius was rasped sequentially beginning with a 5 mm rasp and progressing to a 7 mm rasp. This provided excellent cortical chatter. The trial 7 mm stem was inserted and both the 10 x 22 mm and 10 x 22 mm implants were trialed. The 10 x 22 mm implant demonstrated excellent stability to flexion and extension, as well as with pronation and supination. It also appeared to fit well under FluoroScan imaging in AP and lateral projections. The joint also was stable to varus and valgus stressing with this implant in place.   The trial implants were removed and the elbow joint thoroughly irrigated with sterile saline solution to remove any fracture hematoma. The permanent 7 mm stem was inserted before the 12 x 22 mm radial head implant was positioned and secured using the appropriate locking screw. Again the elbow was assessed both dynamically and by FluoroScan imaging with the findings as described above.  The wound was copiously irrigated with sterile saline solution before the annular ligament was reapproximated using #0 Vicryl  interrupted sutures. The extensor digitorum communis tendon was reapproximated using 2-0 Vicryl interrupted sutures before the subcutaneous tissues were closed in 2 layers using 2-0 Vicryl interrupted sutures. The skin was closed using staples. A total of 30 cc of 0.5% Sensorcaine with epinephrine was injected in and around the incision to help with postoperative analgesia  before a sterile bulky dressing was applied to the elbow. The patient was placed into a long-arm posterior splint before she was awakened, extubated, and returned to the recovery room in satisfactory condition after tolerating the procedure well.

## 2017-08-11 ENCOUNTER — Encounter: Payer: Self-pay | Admitting: Surgery

## 2017-08-25 ENCOUNTER — Other Ambulatory Visit: Payer: Self-pay | Admitting: Internal Medicine

## 2017-08-25 DIAGNOSIS — E119 Type 2 diabetes mellitus without complications: Secondary | ICD-10-CM

## 2018-01-03 ENCOUNTER — Other Ambulatory Visit: Payer: Self-pay | Admitting: Internal Medicine

## 2018-01-03 DIAGNOSIS — Z1231 Encounter for screening mammogram for malignant neoplasm of breast: Secondary | ICD-10-CM

## 2018-01-03 DIAGNOSIS — J449 Chronic obstructive pulmonary disease, unspecified: Secondary | ICD-10-CM

## 2018-01-03 DIAGNOSIS — R221 Localized swelling, mass and lump, neck: Secondary | ICD-10-CM

## 2018-01-07 ENCOUNTER — Ambulatory Visit
Admission: RE | Admit: 2018-01-07 | Discharge: 2018-01-07 | Disposition: A | Discharge status: Home / Self Care | Payer: BLUE CROSS/BLUE SHIELD | Source: Ambulatory Visit | Attending: Internal Medicine | Admitting: Internal Medicine

## 2018-01-07 DIAGNOSIS — I6521 Occlusion and stenosis of right carotid artery: Secondary | ICD-10-CM | POA: Diagnosis not present

## 2018-01-07 DIAGNOSIS — R221 Localized swelling, mass and lump, neck: Secondary | ICD-10-CM | POA: Diagnosis present

## 2018-01-07 DIAGNOSIS — J449 Chronic obstructive pulmonary disease, unspecified: Secondary | ICD-10-CM

## 2018-01-07 MED ORDER — IOPAMIDOL (ISOVUE-300) INJECTION 61%
75.0000 mL | Freq: Once | INTRAVENOUS | Status: AC | PRN
  Administered 2018-01-07: 75 mL via INTRAVENOUS

## 2018-01-11 ENCOUNTER — Other Ambulatory Visit: Payer: Self-pay | Admitting: Internal Medicine

## 2018-01-11 DIAGNOSIS — Z1231 Encounter for screening mammogram for malignant neoplasm of breast: Secondary | ICD-10-CM

## 2018-01-28 ENCOUNTER — Ambulatory Visit
Admission: RE | Admit: 2018-01-28 | Discharge: 2018-01-28 | Disposition: A | Discharge status: Home / Self Care | Payer: BLUE CROSS/BLUE SHIELD | Source: Ambulatory Visit | Attending: Internal Medicine | Admitting: Internal Medicine

## 2018-01-28 ENCOUNTER — Ambulatory Visit (INDEPENDENT_AMBULATORY_CARE_PROVIDER_SITE_OTHER): Payer: BLUE CROSS/BLUE SHIELD | Admitting: Vascular Surgery

## 2018-01-28 ENCOUNTER — Encounter (INDEPENDENT_AMBULATORY_CARE_PROVIDER_SITE_OTHER): Payer: Self-pay | Admitting: Vascular Surgery

## 2018-01-28 VITALS — BP 152/79 | HR 87 | Resp 17 | Ht 63.0 in | Wt 196.0 lb

## 2018-01-28 DIAGNOSIS — J449 Chronic obstructive pulmonary disease, unspecified: Secondary | ICD-10-CM | POA: Diagnosis not present

## 2018-01-28 DIAGNOSIS — Z1231 Encounter for screening mammogram for malignant neoplasm of breast: Secondary | ICD-10-CM | POA: Insufficient documentation

## 2018-01-28 DIAGNOSIS — I6521 Occlusion and stenosis of right carotid artery: Secondary | ICD-10-CM | POA: Diagnosis not present

## 2018-01-28 DIAGNOSIS — E119 Type 2 diabetes mellitus without complications: Secondary | ICD-10-CM | POA: Insufficient documentation

## 2018-01-28 DIAGNOSIS — I6529 Occlusion and stenosis of unspecified carotid artery: Secondary | ICD-10-CM

## 2018-01-28 NOTE — Assessment & Plan Note (Signed)
CT scan performed by her primary care physician which I have independently reviewed.  Although this was not a CT angiogram, it was done with contrast and the patient does have a high-grade right internal carotid artery stenosis.  Her left carotid disease was mild.  Recommend:  The patient remains asymptomatic with respect to the carotid stenosis.  However, the patient has now progressed and has a lesion the is >75%.  Patient's CT angiography of the carotid arteries confirms >75% right ICA stenosis.  The anatomical considerations support surgery over stenting.  This was discussed in detail with the patient.  The patient does indeed need surgery, therefore, cardiac clearance will be arranged. Once cleared the patient will be scheduled for surgery.  The risks, benefits and alternative therapies were reviewed in detail with the patient.  All questions were answered.  The patient agrees to proceed with surgery of the right carotid artery.   Continue management of CAD, HTN and Hyperlipidemia. Healthy heart diet, encouraged exercise at least 4 times per week.

## 2018-01-28 NOTE — Progress Notes (Signed)
Patient ID: Tanya Ryan, female   DOB: 08-22-1950, 67 y.o.   MRN: [id number]  Chief Complaint  Patient presents with  . New Patient (Initial Visit)    Carotid ASO    HPI Tanya Ryan is a 67 y.o. female.  I am asked to see the patient by Dr. Ginette Pitman for evaluation of carotid stenosis.  The patient reports having a lump on the left neck that came up several weeks ago.  This prompted a CT scan performed by her primary care physician which I have independently reviewed.  Although this was not a CT angiogram, it was done with contrast and the patient does have a high-grade right internal carotid artery stenosis.  Her left carotid disease was mild.  She has not had a previous stroke, TIA, or other focal neurologic symptom.  This was essentially asymptomatic and an incidental finding.   Past Medical History:  Diagnosis Date  . Anxiety   . Asthma   . Cancer ([place])    skin  . COPD (chronic obstructive pulmonary disease) ([place])   . Diabetes mellitus without complication ([place])   . GERD (gastroesophageal reflux disease)   . History of broken nose 1970  . Restless leg syndrome     Past Surgical History:  Procedure Laterality Date  . BUNIONECTOMY  1990  . COLONOSCOPY  [id number]  . RADIAL HEAD ARTHROPLASTY Left 08/10/2017   Procedure: RADIAL HEAD ARTHROPLASTY;  Surgeon: Corky Mull, MD;  Location: [facility] ORS;  Service: Orthopedics;  Laterality: Left;  . TUBAL LIGATION  1977  . YAG LASER APPLICATION Bilateral 28/3151    Family History  Problem Relation Age of Onset  . Breast cancer Neg Hx   no bleeding disorders, clotting disorders, autoimmune diseases or aneurysms  Social History Social History   Tobacco Use  . Smoking status: Former Smoker    Packs/day: 1.00    Years: 45.00    Pack years: 45.00    Types: Cigarettes    Last attempt to quit: 08/04/2015    Years since quitting: 2.4  . Smokeless tobacco: Never Used  Substance Use Topics  . Alcohol use: Yes    Frequency: Never   Comment: socially  . Drug use: No     Allergies  Allergen Reactions  . Metformin Diarrhea    Current Outpatient Medications  Medication Sig Dispense Refill  . albuterol (PROVENTIL HFA) 108 (90 Base) MCG/ACT inhaler Inhale into the lungs.    . ALPRAZolam (XANAX) 0.25 MG tablet Take 0.25 mg by mouth 2 (two) times daily as needed for anxiety.    Marland Kitchen b complex vitamins tablet Take 1 tablet by mouth daily.    . calcium-vitamin D (OSCAL WITH D) 500-200 MG-UNIT tablet Take 1 tablet by mouth daily with breakfast.    . cholecalciferol (VITAMIN D) 1000 units tablet Take 1,250 Units by mouth daily.    . Fluticasone-Salmeterol (ADVAIR) 250-50 MCG/DOSE AEPB Inhale 1 puff into the lungs 2 (two) times daily.    Marland Kitchen glimepiride (AMARYL) 2 MG tablet Take by mouth.    . lidocaine-hydrocortisone (ANAMANTEL HC) 3-0.5 % CREA Apply topically.    Marland Kitchen loratadine (CLARITIN) 10 MG tablet Take 10 mg by mouth daily.    . naproxen sodium (ALEVE) 220 MG tablet Take 220 mg by mouth 2 (two) times daily as needed.    . Omega-3 Fatty Acids (FISH OIL) 1000 MG CAPS Take 1 capsule by mouth daily.    Marland Kitchen omeprazole (PRILOSEC) 20 MG capsule Take 20 mg  by mouth daily.    . pramipexole (MIRAPEX) 0.25 MG tablet Take by mouth.    . pyridoxine (B-6) 200 MG tablet Take 200 mg by mouth daily.    . sertraline (ZOLOFT) 100 MG tablet Take 50 mg by mouth daily.    . sitaGLIPtin (JANUVIA) 50 MG tablet Take 50 mg by mouth daily.    . Vit C-Cholecalciferol-Rose Hip (VITAMIN C & D3/ROSE HIPS) [phone] MG-UNIT-MG CAPS Take 1 tablet by mouth daily.    . vitamin B-12 (CYANOCOBALAMIN) 1000 MCG tablet Take 1,000 mcg by mouth 2 (two) times daily.    . Vitamin D, Ergocalciferol, (DRISDOL) 50000 units CAPS capsule Take by mouth.    . vitamin E 400 UNIT capsule Take 400 Units by mouth daily.    Marland Kitchen oxyCODONE (ROXICODONE) 5 MG immediate release tablet Take 1-2 tablets (5-10 mg total) by mouth every 4 (four) hours as needed. (Patient not taking: Reported  on 01/28/2018) 40 tablet 0   No current facility-administered medications for this visit.       REVIEW OF SYSTEMS (Negative unless checked)  Constitutional: [] Weight loss  [] Fever  [] Chills Cardiac: [] Chest pain   [] Chest pressure   [] Palpitations   [] Shortness of breath when laying flat   [] Shortness of breath at rest   [] Shortness of breath with exertion. Vascular:  [] Pain in legs with walking   [] Pain in legs at rest   [] Pain in legs when laying flat   [] Claudication   [] Pain in feet when walking  [] Pain in feet at rest  [] Pain in feet when laying flat   [] History of DVT   [] Phlebitis   [] Swelling in legs   [] Varicose veins   [] Non-healing ulcers Pulmonary:   [] Uses home oxygen   [] Productive cough   [] Hemoptysis   [] Wheeze  [x] COPD   [] Asthma Neurologic:  [] Dizziness  [] Blackouts   [] Seizures   [] History of stroke   [] History of TIA  [] Aphasia   [] Temporary blindness   [] Dysphagia   [] Weakness or numbness in arms   [] Weakness or numbness in legs Musculoskeletal:  [x] Arthritis   [] Joint swelling   [x] Joint pain   [] Low back pain Hematologic:  [] Easy bruising  [] Easy bleeding   [] Hypercoagulable state   [] Anemic  [] Hepatitis Gastrointestinal:  [] Blood in stool   [] Vomiting blood  [x] Gastroesophageal reflux/heartburn   [] Abdominal pain Genitourinary:  [] Chronic kidney disease   [] Difficult urination  [] Frequent urination  [] Burning with urination   [] Hematuria Skin:  [] Rashes   [] Ulcers   [] Wounds Psychological:  [x] History of anxiety   []  History of major depression.    Physical Exam BP (!) 152/79 (BP Location: Right Arm, Patient Position: Sitting)   Pulse 87   Resp 17   Ht 5\' 3"  (1.6 m)   Wt 196 lb (88.9 kg)   BMI 34.72 kg/m  Gen:  WD/WN, NAD Head: [place]/AT, No temporalis wasting.  Ear/Nose/Throat: Hearing grossly intact, nares w/o erythema or drainage, oropharynx w/o Erythema/Exudate Eyes: Conjunctiva clear, sclera non-icteric  Neck: trachea midline.  Soft right carotid  bruit Pulmonary:  Good air movement, clear to auscultation bilaterally.  Cardiac: RRR, no JVD Vascular:  Vessel Right Left  Radial Palpable Palpable                                   Gastrointestinal: soft, non-tender/non-distended.  Musculoskeletal: M/S 5/5 throughout.  Extremities without ischemic changes.  No deformity or atrophy.  No edema.  Neurologic: Sensation grossly intact in extremities.  Symmetrical.  Speech is fluent. Motor exam as listed above. Psychiatric: Judgment intact, Mood & affect appropriate for pt's clinical situation. Dermatologic: No rashes or ulcers noted.  No cellulitis or open wounds.  Radiology Ct Soft Tissue Neck W Contrast  Result Date: 01/07/2018 CLINICAL DATA:  Left lateral neck mass notice January 2019 EXAM: CT NECK WITH CONTRAST TECHNIQUE: Multidetector CT imaging of the neck was performed using the standard protocol following the bolus administration of intravenous contrast. CONTRAST:  24mL ISOVUE-300 IOPAMIDOL (ISOVUE-300) INJECTION 61% COMPARISON:  None. FINDINGS: Pharynx and larynx: No evidence of mass or inflammation. Salivary glands: Symmetric and negative Thyroid: Peripherally calcified nodule in the left lobe measuring 13 mm, below size threshold for strict sonographic follow-up recommendation per consensus guidelines. Lymph nodes: None enlarged or abnormal density. Palpable marker overlaps the left posterior triangle, which is negative. Vascular: Cervical carotid atherosclerosis with high-grade narrowing at the right ICA bulb, lumen essentially nonvisualized where most stenotic. Limited intracranial: Negative Visualized orbits: Not covered Mastoids and visualized paranasal sinuses: Clear Skeleton: Degenerative changes without acute or aggressive finding. Upper chest: Negative IMPRESSION: 1. No mass or adenopathy to explain the palpable complaint. 2. Carotid atherosclerosis with high-grade right ICA bulb narrowing. Recommend vascular referral.  Electronically Signed   By: Monte Fantasia M.D.   On: 01/07/2018 09:36    Labs No results found for this or any previous visit (from the past 2160 hour(s)).  Assessment/Plan:  COPD (chronic obstructive pulmonary disease) (HCC) Continue pulmonary medications and aerosols as already ordered, these medications have been reviewed and there are no changes at this time.    Diabetes mellitus without complication (HCC) blood glucose control important in reducing the progression of atherosclerotic disease. Also, involved in wound healing. On appropriate medications.   Carotid stenosis CT scan performed by her primary care physician which I have independently reviewed.  Although this was not a CT angiogram, it was done with contrast and the patient does have a high-grade right internal carotid artery stenosis.  Her left carotid disease was mild.  Recommend:  The patient remains asymptomatic with respect to the carotid stenosis.  However, the patient has now progressed and has a lesion the is >75%.  Patient's CT angiography of the carotid arteries confirms >75% right ICA stenosis.  The anatomical considerations support surgery over stenting.  This was discussed in detail with the patient.  The patient does indeed need surgery, therefore, cardiac clearance will be arranged. Once cleared the patient will be scheduled for surgery.  The risks, benefits and alternative therapies were reviewed in detail with the patient.  All questions were answered.  The patient agrees to proceed with surgery of the right carotid artery.   Continue management of CAD, HTN and Hyperlipidemia. Healthy heart diet, encouraged exercise at least 4 times per week.        Leotis Pain 01/28/2018, 3:27 PM   This note was created with Dragon medical transcription system.  Any errors from dictation are unintentional.

## 2018-01-28 NOTE — Assessment & Plan Note (Signed)
Continue pulmonary medications and aerosols as already ordered, these medications have been reviewed and there are no changes at this time.   

## 2018-01-28 NOTE — Patient Instructions (Signed)
Carotid Endarterectomy A carotid endarterectomy is a surgery to remove a blockage in the carotid arteries. The carotid arteries are the large blood vessels on both sides of the neck that supply blood to the brain. Carotid artery disease, also called carotid artery stenosis, is the narrowing or blockage of one or both carotid arteries. Carotid artery disease is usually caused by atherosclerosis, which is a buildup of fat and plaques in the arteries. Some buildup of plaques normally occurs with aging. The plaques may partially or totally block blood flow or cause a clot to form in the carotid arteries. This may cause a stroke. Tell a health care provider about:  Any allergies you have.  All medicines you are taking, including vitamins, herbs, eye drops, creams, and over-the-counter medicines.  Any problems you or family members have had with anesthetic medicines.  Any blood disorders you have.  Any surgeries you have had.  Any medical conditions you have, including diabetes, kidney problems, and infections.  Whether you are pregnant or may be pregnant. What are the risks? Generally, this is a safe procedure. However, problems may occur, including:  Infection.  Bleeding.  Blood clots.  Allergic reactions to medicines.  Damage to nerves near the carotid arteries. This can cause a hoarse voice or weakness of muscles your face.  Stroke.  Seizures.  Heart attack (myocardial infarction).  Narrowing of the opened blood vessel (re-stenosis) . This may require another surgery.  What happens before the procedure? Medicines  Ask your health care provider about: ? Changing or stopping your regular medicines. This is especially important if you are taking diabetes medicines or blood thinners. ? Taking medicines such as aspirin and ibuprofen. These medicines can thin your blood. Do not take these medicines before your procedure if your health care provider instructs you not to.  You may  be given antibiotic medicine to help prevent infection. Staying hydrated Follow instructions from your health care provider about hydration, which may include:  Up to 2 hours before the procedure - you may continue to drink clear liquids, such as water, clear fruit juice, black coffee, and plain tea.  Eating and drinking restrictions Follow instructions from your health care provider about eating and drinking, which may include:  8 hours before the procedure - stop eating heavy meals or foods such as meat, fried foods, or fatty foods.  6 hours before the procedure - stop eating light meals or foods, such as toast or cereal.  6 hours before the procedure - stop drinking milk or drinks that contain milk.  2 hours before the procedure - stop drinking clear liquids.  General instructions  Do not use any products that contain nicotine or tobacco, such as cigarettes and e-cigarettes. These can delay healing after surgery. If you need help quitting, ask your health care provider.  You may need to have blood tests, a test to check heart rhythm (electrocardiogram), or a test to check blood flow (angiogram).  Plan to have someone take you home from the hospital or clinic.  Ask your health care provider how your surgical site will be marked or identified.  You may be asked to shower with a germ-killing soap. What happens during the procedure?  To lower your risk of infection: ? Your health care team will wash or sanitize their hands. ? Your skin will be washed with soap. ? Hair may be removed from the surgical area.  An IV tube will be inserted into one of your veins.  You   will be given one or more of the following: ? A medicine to help you relax (sedative). ? A medicine to make you fall asleep (general anesthetic).  The surgeon will make a small incision in your neck to expose the carotid artery.  A tube may be inserted into the carotid artery above and below the blockage. This tube  will temporarily allow blood to flow around the blockage during the surgery.  An incision will be made in the carotid artery at the location of the blockage, then the blockage will be removed. In some cases, a section of the carotid artery may be removed and a graft patch may be used to repair the artery.  The carotid artery will be closed with stitches (sutures).  If a tube was inserted into the artery to allow blood flow around the blockage during surgery, the tube will be removed. With the tube removed, blood flow to the brain will be restored through the carotid artery.  The incision in the neck will be closed with sutures.  A bandage (dressing) will be placed over your incision. The procedure may vary among health care providers and hospitals. What happens after the procedure?  Your blood pressure, heart rate, breathing rate, and blood oxygen level will be monitored until the medicines you were given have worn off.  You may have some pain or an ache in your neck for a couple weeks. This is normal.  Do not drive for 24 hours if you were given a sedative. Summary  A carotid endarterectomy is a surgery to remove a blockage in the carotid arteries.  The carotid arteries are the large blood vessels on both sides of the neck that supply blood to the brain.  Before the procedure, ask your health care provider about changing or stopping your regular medicines.  Follow instructions from your health care provider about eating and drinking before the procedure.  After the procedure, do not drive for 24 hours if you were given a sedative. This information is not intended to replace advice given to you by your health care provider. Make sure you discuss any questions you have with your health care provider. Document Released: 03/22/2013 Document Revised: 06/08/2016 Document Reviewed: 06/08/2016 Elsevier Interactive Patient Education  2017 [employer]  

## 2018-01-28 NOTE — Assessment & Plan Note (Signed)
blood glucose control important in reducing the progression of atherosclerotic disease. Also, involved in wound healing. On appropriate medications.  

## 2018-01-31 DIAGNOSIS — I499 Cardiac arrhythmia, unspecified: Secondary | ICD-10-CM

## 2018-01-31 DIAGNOSIS — Z01810 Encounter for preprocedural cardiovascular examination: Secondary | ICD-10-CM | POA: Insufficient documentation

## 2018-01-31 DIAGNOSIS — R0602 Shortness of breath: Secondary | ICD-10-CM | POA: Insufficient documentation

## 2018-01-31 HISTORY — DX: Cardiac arrhythmia, unspecified: I49.9

## 2018-02-15 DIAGNOSIS — R9439 Abnormal result of other cardiovascular function study: Secondary | ICD-10-CM | POA: Insufficient documentation

## 2018-02-21 ENCOUNTER — Telehealth (INDEPENDENT_AMBULATORY_CARE_PROVIDER_SITE_OTHER): Payer: Self-pay | Admitting: Vascular Surgery

## 2018-02-21 NOTE — Telephone Encounter (Signed)
I have the results of the stress test.

## 2018-02-21 NOTE — Telephone Encounter (Signed)
Patient said she has done her stress test and was told that the results have been sent to [facility]. She said she has not heard anything about her surgery yet.

## 2018-02-22 ENCOUNTER — Encounter (INDEPENDENT_AMBULATORY_CARE_PROVIDER_SITE_OTHER): Payer: Self-pay

## 2018-02-22 ENCOUNTER — Telehealth (INDEPENDENT_AMBULATORY_CARE_PROVIDER_SITE_OTHER): Payer: Self-pay | Admitting: Vascular Surgery

## 2018-03-07 ENCOUNTER — Other Ambulatory Visit (INDEPENDENT_AMBULATORY_CARE_PROVIDER_SITE_OTHER): Payer: Self-pay | Admitting: Vascular Surgery

## 2018-03-10 ENCOUNTER — Encounter
Admission: RE | Admit: 2018-03-10 | Discharge: 2018-03-10 | Disposition: A | Discharge status: Home / Self Care | Payer: BLUE CROSS/BLUE SHIELD | Source: Ambulatory Visit | Attending: Vascular Surgery | Admitting: Vascular Surgery

## 2018-03-10 ENCOUNTER — Other Ambulatory Visit: Payer: Self-pay

## 2018-03-10 DIAGNOSIS — Z01812 Encounter for preprocedural laboratory examination: Secondary | ICD-10-CM | POA: Diagnosis not present

## 2018-03-10 HISTORY — DX: Dyspnea, unspecified: R06.00

## 2018-03-10 HISTORY — DX: Anemia, unspecified: D64.9

## 2018-03-10 HISTORY — DX: Adverse effect of unspecified anesthetic, initial encounter: T41.45XA

## 2018-03-10 HISTORY — DX: Major depressive disorder, single episode, unspecified: F32.9

## 2018-03-10 HISTORY — DX: Depression, unspecified: F32.A

## 2018-03-10 HISTORY — DX: Cardiac arrhythmia, unspecified: I49.9

## 2018-03-10 LAB — SURGICAL PCR SCREEN
MRSA, PCR: NEGATIVE
Staphylococcus aureus: NEGATIVE

## 2018-03-10 LAB — BASIC METABOLIC PANEL
Anion gap: 8 (ref 5–15)
BUN: 19 mg/dL (ref 8–23)
CALCIUM: 9.5 mg/dL (ref 8.9–10.3)
CO2: 27 mmol/L (ref 22–32)
Chloride: 107 mmol/L (ref 98–111)
Creatinine, Ser: 0.73 mg/dL (ref 0.44–1.00)
GFR calc non Af Amer: >60 mL/min (ref >60)
Glucose, Bld: 110 mg/dL — ABNORMAL HIGH (ref 70–99)
Potassium: 3.8 mmol/L (ref 3.5–5.1)
SODIUM: 142 mmol/L (ref 135–145)

## 2018-03-10 LAB — PROTIME-INR
INR: 1
PROTHROMBIN TIME: 13.1 s (ref 11.4–15.2)

## 2018-03-10 LAB — CBC WITH DIFFERENTIAL/PLATELET
BASOS PCT: 1 %
Basophils Absolute: 0 10*3/uL (ref 0–0.1)
EOS PCT: 3 %
Eosinophils Absolute: 0.2 10*3/uL (ref 0–0.7)
HCT: 40 % (ref 35.0–47.0)
HEMOGLOBIN: 13.7 g/dL (ref 12.0–16.0)
LYMPHS ABS: 2.4 10*3/uL (ref 1.0–3.6)
Lymphocytes Relative: 29 %
MCH: 29.7 pg (ref 26.0–34.0)
MCHC: 34.2 g/dL (ref 32.0–36.0)
MCV: 86.9 fL (ref 80.0–100.0)
MONOS PCT: 9 %
Monocytes Absolute: 0.7 10*3/uL (ref 0.2–0.9)
NEUTROS PCT: 60 %
Neutro Abs: 5 10*3/uL (ref 1.4–6.5)
PLATELETS: 257 10*3/uL (ref 150–440)
RBC: 4.6 MIL/uL (ref 3.80–5.20)
RDW: 13.4 % (ref 11.5–14.5)
WBC: 8.4 10*3/uL (ref 3.6–11.0)

## 2018-03-10 LAB — TYPE AND SCREEN
ABO/RH(D): O POS
Antibody Screen: NEGATIVE

## 2018-03-10 LAB — APTT: APTT: 27 s (ref 24–36)

## 2018-03-10 NOTE — Pre-Procedure Instructions (Signed)
ECG 12-lead7/08/2017 [place] Component Name Value Ref Range  Vent Rate (bpm) 74   PR Interval (msec) 140   QRS Interval (msec) 136   QT Interval (msec) 434   QTc (msec) 481   Other Result Information  This result has an attachment that is not available.  Result Narrative  Normal sinus rhythm Left bundle branch block Abnormal ECG No previous ECGs available I reviewed and concur with this report. Electronically signed QI:[id number], MD, ALEX ([id number]) on 02/14/2018 8:22:28 AM   Clearance on chart from dr. Saralyn Pilar

## 2018-03-10 NOTE — Patient Instructions (Signed)
Your procedure is scheduled on: Thursday, March 17, 2018  Report to [facility].   DO NOT STOP ON THE FIRST FLOOR TO REGISTER.  To find out your arrival time please call [phone] between 1PM - 3PM on Wednesday, March 16, 2018  Remember: Instructions that are not followed completely may result in serious medical risk,  up to and including death, or upon the discretion of your surgeon and anesthesiologist your  surgery may need to be rescheduled.     _X__ 1. Do not eat food after midnight the night before your procedure.                 No gum chewing or hard candies.  NOTHING IN YOUR MOUTH THAT IS SOLID AFTER MIDNIGHT                 You may drink clear liquids up to 2 hours before you are scheduled to arrive for your surgery-                  DO not drink clear liquids within 2 hours of the start of your surgery.                  Clear Liquids include:  water, apple juice without pulp, clear carbohydrate                 drink such as Clearfast of Gatorade, Black Coffee or Tea (Do not add                 anything to coffee or tea). NO DAIRY OR MILK PRODUCTS / NO SOUPS OR BROTH  __X__2.  On the morning of surgery brush your teeth with toothpaste and water,                  You may rinse your mouth with mouthwash if you wish.                     Do not swallow any toothpaste of mouthwash.     _X__ 3.  No Alcohol for 24 hours before or after surgery.   _X__ 4.  Do Not Smoke or use e-cigarettes For 24 Hours Prior to Your Surgery.                 Do not use any chewable tobacco products for at least 6 hours prior to                 surgery.  ____  5.  Bring all medications with you on the day of surgery if instructed.   _X___  6.  Notify your doctor if there is any change in your medical condition      (cold, fever, infections).     Do not wear jewelry, make-up, hairpins, clips or nail polish. Do not wear lotions, powders, or  perfumes. You may wear deodorant. Do not shave 48 hours prior to surgery. Men may shave face and neck. Do not bring valuables to the hospital.    [facility] is not responsible for any belongings or valuables.  Contacts, dentures or bridgework may not be worn into surgery. Leave your suitcase in the car. After surgery it may be brought to your room. For patients admitted to the hospital, discharge time is determined by your treatment team.   Patients discharged the day of surgery will not be allowed to drive home.   Please  read over the following fact sheets that you were given:   PREPARING FOR SURGERY                MRSA: SPREAD THE WORD   ____ Take these medicines the morning of surgery with A SIP OF WATER:    1.XANAX  2. ADVAIR  3. CLARITIN  4. PRILOSEC  5. ZOLOFT  6. MIRAPEX  ____ Fleet Enema (as directed)   _X___ Use CHG Soap as directed  __X__ Use inhalers on the day of surgery               DO NOT TAKE ANY ORAL DIABETIC MEDICINES ON THE DAY OF SURGERY.               THIS INCLUDES GLIMEPIRIDE AND JANUVIA.             CONTINUE TAKING THESE UP UNTIL THE DAY OF SURGERY  _X___ Stop ALL ASPIRIN PRODUCTS AS OF TODAY  __X__ Stop Anti-inflammatories AS OF TODAY              THIS INCLUDES IBUPROFEN / MOTRIN / ADVIL / ALEVE   _X___ Stop supplements until after surgery.                THIS INCLUDES ALL SUPPLEMENTS EXCEPT VITAMIN D (TAKE ON Sunday)  ____ Bring C-Pap to the hospital.   [place].  THE ONLY MEDICATIONS THAT YOU NEED TO STOP TODAY WILL BE THE ANTIINFLAMMATORIES AND VITAMIN SUPPLEMENTS.  CONTINUE TAKING DIABETIC MEDICATIONS (GLIMIPERIDE AND JANUVIA) BUT DO NOT TAKE THESE ON THE MORNING OF SURGERY.

## 2018-03-16 MED ORDER — CEFAZOLIN SODIUM-DEXTROSE 2-4 GM/100ML-% IV SOLN
2.0000 g | INTRAVENOUS | Status: AC
  Administered 2018-03-17: 2 g via INTRAVENOUS

## 2018-03-17 ENCOUNTER — Encounter: Payer: Self-pay | Admitting: Anesthesiology

## 2018-03-17 ENCOUNTER — Inpatient Hospital Stay: Payer: BLUE CROSS/BLUE SHIELD | Admitting: Certified Registered"

## 2018-03-17 ENCOUNTER — Encounter
Admission: RE | Disposition: A | Discharge status: Home / Self Care | Payer: Self-pay | Source: Ambulatory Visit | Attending: Vascular Surgery

## 2018-03-17 ENCOUNTER — Inpatient Hospital Stay
Admission: RE | Admit: 2018-03-17 | Discharge: 2018-03-18 | DRG: 039 | Disposition: A | Discharge status: Home / Self Care | Payer: BLUE CROSS/BLUE SHIELD | Source: Ambulatory Visit | Attending: Vascular Surgery | Admitting: Vascular Surgery

## 2018-03-17 ENCOUNTER — Other Ambulatory Visit: Payer: Self-pay

## 2018-03-17 DIAGNOSIS — I6521 Occlusion and stenosis of right carotid artery: Secondary | ICD-10-CM | POA: Diagnosis present

## 2018-03-17 DIAGNOSIS — I447 Left bundle-branch block, unspecified: Secondary | ICD-10-CM | POA: Diagnosis present

## 2018-03-17 DIAGNOSIS — D649 Anemia, unspecified: Secondary | ICD-10-CM | POA: Diagnosis present

## 2018-03-17 DIAGNOSIS — Z85828 Personal history of other malignant neoplasm of skin: Secondary | ICD-10-CM

## 2018-03-17 DIAGNOSIS — Z888 Allergy status to other drugs, medicaments and biological substances status: Secondary | ICD-10-CM | POA: Diagnosis not present

## 2018-03-17 DIAGNOSIS — F329 Major depressive disorder, single episode, unspecified: Secondary | ICD-10-CM | POA: Diagnosis present

## 2018-03-17 DIAGNOSIS — F419 Anxiety disorder, unspecified: Secondary | ICD-10-CM | POA: Diagnosis present

## 2018-03-17 DIAGNOSIS — Z87891 Personal history of nicotine dependence: Secondary | ICD-10-CM

## 2018-03-17 DIAGNOSIS — E1151 Type 2 diabetes mellitus with diabetic peripheral angiopathy without gangrene: Secondary | ICD-10-CM | POA: Diagnosis present

## 2018-03-17 DIAGNOSIS — G2581 Restless legs syndrome: Secondary | ICD-10-CM | POA: Diagnosis present

## 2018-03-17 DIAGNOSIS — J449 Chronic obstructive pulmonary disease, unspecified: Secondary | ICD-10-CM | POA: Diagnosis present

## 2018-03-17 DIAGNOSIS — K219 Gastro-esophageal reflux disease without esophagitis: Secondary | ICD-10-CM | POA: Diagnosis present

## 2018-03-17 DIAGNOSIS — Z96622 Presence of left artificial elbow joint: Secondary | ICD-10-CM | POA: Diagnosis present

## 2018-03-17 HISTORY — PX: ENDARTERECTOMY: SHX5162

## 2018-03-17 LAB — GLUCOSE, CAPILLARY
GLUCOSE-CAPILLARY: 107 mg/dL — AB (ref 70–99)
Glucose-Capillary: 157 mg/dL — ABNORMAL HIGH (ref 70–99)
Glucose-Capillary: 172 mg/dL — ABNORMAL HIGH (ref 70–99)

## 2018-03-17 LAB — ABO/RH: ABO/RH(D): O POS

## 2018-03-17 SURGERY — ENDARTERECTOMY, CAROTID
Anesthesia: General | Laterality: Right

## 2018-03-17 MED ORDER — DEXAMETHASONE SODIUM PHOSPHATE 10 MG/ML IJ SOLN
INTRAMUSCULAR | Status: DC | PRN
  Administered 2018-03-17: 10 mg via INTRAVENOUS

## 2018-03-17 MED ORDER — PROPOFOL 10 MG/ML IV BOLUS
INTRAVENOUS | Status: DC | PRN
  Administered 2018-03-17: 150 mg via INTRAVENOUS

## 2018-03-17 MED ORDER — VITAMIN E 180 MG (400 UNIT) PO CAPS
400.0000 [IU] | ORAL_CAPSULE | Freq: Every day | ORAL | Status: DC
  Filled 2018-03-17 (×2): qty 1

## 2018-03-17 MED ORDER — CHLORHEXIDINE GLUCONATE CLOTH 2 % EX PADS: 6.0000 | MEDICATED_PAD | Freq: Once | CUTANEOUS | Status: DC

## 2018-03-17 MED ORDER — SUCCINYLCHOLINE CHLORIDE 20 MG/ML IJ SOLN
INTRAMUSCULAR | Status: AC
  Filled 2018-03-17: qty 1

## 2018-03-17 MED ORDER — LIDOCAINE HCL (PF) 2 % IJ SOLN
INTRAMUSCULAR | Status: AC
  Filled 2018-03-17: qty 10

## 2018-03-17 MED ORDER — LIDOCAINE HCL 1 % IJ SOLN
INTRAMUSCULAR | Status: DC | PRN
  Administered 2018-03-17: 10 mL

## 2018-03-17 MED ORDER — FENTANYL CITRATE (PF) 100 MCG/2ML IJ SOLN
INTRAMUSCULAR | Status: AC
  Administered 2018-03-17: 25 ug via INTRAVENOUS
  Filled 2018-03-17: qty 2

## 2018-03-17 MED ORDER — CALCIUM CARBONATE-VITAMIN D 500-200 MG-UNIT PO TABS
1.0000 | ORAL_TABLET | Freq: Every day | ORAL | Status: DC
  Administered 2018-03-18: 1 via ORAL
  Filled 2018-03-17: qty 1

## 2018-03-17 MED ORDER — EPHEDRINE SULFATE 50 MG/ML IJ SOLN
INTRAMUSCULAR | Status: DC | PRN
  Administered 2018-03-17: 10 mg via INTRAVENOUS

## 2018-03-17 MED ORDER — DEXAMETHASONE SODIUM PHOSPHATE 10 MG/ML IJ SOLN
INTRAMUSCULAR | Status: AC
  Filled 2018-03-17: qty 1

## 2018-03-17 MED ORDER — ONDANSETRON HCL 4 MG/2ML IJ SOLN
INTRAMUSCULAR | Status: AC
  Filled 2018-03-17: qty 2

## 2018-03-17 MED ORDER — OXYCODONE-ACETAMINOPHEN 5-325 MG PO TABS: 1.0000 | ORAL_TABLET | ORAL | Status: DC | PRN

## 2018-03-17 MED ORDER — SODIUM CHLORIDE 0.9 % IV SOLN
INTRAVENOUS | Status: DC
  Administered 2018-03-17 (×2): via INTRAVENOUS

## 2018-03-17 MED ORDER — ATORVASTATIN CALCIUM 10 MG PO TABS
10.0000 mg | ORAL_TABLET | Freq: Every day | ORAL | Status: DC
  Administered 2018-03-17: 10 mg via ORAL
  Filled 2018-03-17: qty 1

## 2018-03-17 MED ORDER — ESMOLOL HCL-SODIUM CHLORIDE 2000 MG/100ML IV SOLN
25.0000 ug/kg/min | INTRAVENOUS | Status: DC
  Filled 2018-03-17: qty 100

## 2018-03-17 MED ORDER — PHENOL 1.4 % MT LIQD
1.0000 | OROMUCOSAL | Status: DC | PRN
  Filled 2018-03-17: qty 177

## 2018-03-17 MED ORDER — GLIMEPIRIDE 2 MG PO TABS
2.0000 mg | ORAL_TABLET | Freq: Every day | ORAL | Status: DC
  Administered 2018-03-18: 2 mg via ORAL
  Filled 2018-03-17: qty 1

## 2018-03-17 MED ORDER — FENTANYL CITRATE (PF) 100 MCG/2ML IJ SOLN
INTRAMUSCULAR | Status: DC | PRN
  Administered 2018-03-17: 50 ug via INTRAVENOUS

## 2018-03-17 MED ORDER — SERTRALINE HCL 50 MG PO TABS
50.0000 mg | ORAL_TABLET | Freq: Every day | ORAL | Status: DC
  Administered 2018-03-18: 50 mg via ORAL
  Filled 2018-03-17: qty 1

## 2018-03-17 MED ORDER — PHENYLEPHRINE HCL 10 MG/ML IJ SOLN
INTRAMUSCULAR | Status: DC | PRN
  Administered 2018-03-17: 25 ug/min via INTRAVENOUS

## 2018-03-17 MED ORDER — LORATADINE 10 MG PO TABS
10.0000 mg | ORAL_TABLET | Freq: Every day | ORAL | Status: DC
  Filled 2018-03-17: qty 1

## 2018-03-17 MED ORDER — REMIFENTANIL HCL 1 MG IV SOLR
INTRAVENOUS | Status: AC
  Filled 2018-03-17: qty 1000

## 2018-03-17 MED ORDER — ONDANSETRON HCL 4 MG/2ML IJ SOLN
INTRAMUSCULAR | Status: DC | PRN
  Administered 2018-03-17: 4 mg via INTRAVENOUS

## 2018-03-17 MED ORDER — EVICEL 2 ML EX KIT
PACK | CUTANEOUS | Status: DC | PRN
  Administered 2018-03-17: 2 mL

## 2018-03-17 MED ORDER — VITAMIN D (ERGOCALCIFEROL) 1.25 MG (50000 UNIT) PO CAPS: 50000.0000 [IU] | ORAL_CAPSULE | ORAL | Status: DC

## 2018-03-17 MED ORDER — GLYCOPYRROLATE 0.2 MG/ML IJ SOLN
INTRAMUSCULAR | Status: AC
  Filled 2018-03-17: qty 1

## 2018-03-17 MED ORDER — CLOPIDOGREL BISULFATE 75 MG PO TABS
75.0000 mg | ORAL_TABLET | Freq: Every day | ORAL | Status: DC
  Administered 2018-03-18: 75 mg via ORAL
  Filled 2018-03-17: qty 1

## 2018-03-17 MED ORDER — LIDOCAINE HCL (CARDIAC) PF 100 MG/5ML IV SOSY
PREFILLED_SYRINGE | INTRAVENOUS | Status: DC | PRN
  Administered 2018-03-17: 40 mg via INTRAVENOUS

## 2018-03-17 MED ORDER — MIDAZOLAM HCL 2 MG/2ML IJ SOLN
INTRAMUSCULAR | Status: AC
  Filled 2018-03-17: qty 2

## 2018-03-17 MED ORDER — LABETALOL HCL 5 MG/ML IV SOLN
INTRAVENOUS | Status: DC | PRN
  Administered 2018-03-17 (×2): 10 mg via INTRAVENOUS

## 2018-03-17 MED ORDER — FENTANYL CITRATE (PF) 100 MCG/2ML IJ SOLN
INTRAMUSCULAR | Status: AC
  Filled 2018-03-17: qty 2

## 2018-03-17 MED ORDER — IPRATROPIUM-ALBUTEROL 0.5-2.5 (3) MG/3ML IN SOLN
3.0000 mL | Freq: Once | RESPIRATORY_TRACT | Status: AC
  Administered 2018-03-17: 3 mL via RESPIRATORY_TRACT

## 2018-03-17 MED ORDER — MOMETASONE FURO-FORMOTEROL FUM 200-5 MCG/ACT IN AERO
2.0000 | INHALATION_SPRAY | Freq: Two times a day (BID) | RESPIRATORY_TRACT | Status: DC
  Administered 2018-03-18: 2 via RESPIRATORY_TRACT
  Filled 2018-03-17: qty 8.8

## 2018-03-17 MED ORDER — LINAGLIPTIN 5 MG PO TABS
5.0000 mg | ORAL_TABLET | Freq: Every day | ORAL | Status: DC
  Administered 2018-03-18: 5 mg via ORAL
  Filled 2018-03-17 (×2): qty 1

## 2018-03-17 MED ORDER — ASPIRIN EC 81 MG PO TBEC
81.0000 mg | DELAYED_RELEASE_TABLET | Freq: Every day | ORAL | Status: DC
  Administered 2018-03-18: 81 mg via ORAL
  Filled 2018-03-17 (×2): qty 1

## 2018-03-17 MED ORDER — SODIUM CHLORIDE 0.9 % IV SOLN
INTRAVENOUS | Status: DC
  Administered 2018-03-17 (×2): via INTRAVENOUS

## 2018-03-17 MED ORDER — METOPROLOL TARTRATE 5 MG/5ML IV SOLN: 2.0000 mg | INTRAVENOUS | Status: DC | PRN

## 2018-03-17 MED ORDER — GLYCOPYRROLATE 0.2 MG/ML IJ SOLN
INTRAMUSCULAR | Status: DC | PRN
  Administered 2018-03-17: 0.2 mg via INTRAVENOUS

## 2018-03-17 MED ORDER — HYDRALAZINE HCL 20 MG/ML IJ SOLN: 5.0000 mg | INTRAMUSCULAR | Status: DC | PRN

## 2018-03-17 MED ORDER — SODIUM CHLORIDE 0.9 % IV SOLN
INTRAVENOUS | Status: DC | PRN
  Administered 2018-03-17: 16:00:00 via INTRAMUSCULAR

## 2018-03-17 MED ORDER — ONDANSETRON HCL 4 MG/2ML IJ SOLN: 4.0000 mg | Freq: Once | INTRAMUSCULAR | Status: DC | PRN

## 2018-03-17 MED ORDER — ALPRAZOLAM 0.5 MG PO TABS: 0.2500 mg | ORAL_TABLET | Freq: Two times a day (BID) | ORAL | Status: DC | PRN

## 2018-03-17 MED ORDER — VITAMIN B-12 1000 MCG PO TABS
1000.0000 ug | ORAL_TABLET | Freq: Every day | ORAL | Status: DC
  Filled 2018-03-17 (×2): qty 1

## 2018-03-17 MED ORDER — MAGNESIUM SULFATE 2 GM/50ML IV SOLN: 2.0000 g | Freq: Every day | INTRAVENOUS | Status: DC | PRN

## 2018-03-17 MED ORDER — ACETAMINOPHEN 325 MG PO TABS: 325.0000 mg | ORAL_TABLET | ORAL | Status: DC | PRN

## 2018-03-17 MED ORDER — ATROPINE SULFATE 0.4 MG/ML IJ SOLN
INTRAMUSCULAR | Status: DC | PRN
  Administered 2018-03-17: 0.4 mg via INTRAVENOUS

## 2018-03-17 MED ORDER — SODIUM CHLORIDE 0.9 % IV SOLN: 500.0000 mL | Freq: Once | INTRAVENOUS | Status: DC | PRN

## 2018-03-17 MED ORDER — ESMOLOL HCL 100 MG/10ML IV SOLN
INTRAVENOUS | Status: DC | PRN
  Administered 2018-03-17: 20 mg via INTRAVENOUS
  Administered 2018-03-17: 30 mg via INTRAVENOUS

## 2018-03-17 MED ORDER — B COMPLEX-C PO TABS
1.0000 | ORAL_TABLET | Freq: Every day | ORAL | Status: DC
  Filled 2018-03-17 (×3): qty 1

## 2018-03-17 MED ORDER — SUCCINYLCHOLINE CHLORIDE 20 MG/ML IJ SOLN
INTRAMUSCULAR | Status: DC | PRN
  Administered 2018-03-17: 140 mg via INTRAVENOUS

## 2018-03-17 MED ORDER — MORPHINE SULFATE (PF) 4 MG/ML IV SOLN: 2.0000 mg | INTRAVENOUS | Status: DC | PRN

## 2018-03-17 MED ORDER — MIDAZOLAM HCL 2 MG/2ML IJ SOLN
INTRAMUSCULAR | Status: DC | PRN
  Administered 2018-03-17: 2 mg via INTRAVENOUS

## 2018-03-17 MED ORDER — IPRATROPIUM-ALBUTEROL 0.5-2.5 (3) MG/3ML IN SOLN
RESPIRATORY_TRACT | Status: AC
  Administered 2018-03-17: 3 mL via RESPIRATORY_TRACT
  Filled 2018-03-17: qty 3

## 2018-03-17 MED ORDER — CEFAZOLIN SODIUM-DEXTROSE 2-4 GM/100ML-% IV SOLN
INTRAVENOUS | Status: AC
  Filled 2018-03-17: qty 100

## 2018-03-17 MED ORDER — VIT C-CHOLECALCIFEROL-ROSE HIP 500-1000-20 MG-UNIT-MG PO CAPS: 1.0000 | ORAL_CAPSULE | Freq: Every day | ORAL | Status: DC

## 2018-03-17 MED ORDER — FAMOTIDINE IN NACL 20-0.9 MG/50ML-% IV SOLN
20.0000 mg | Freq: Two times a day (BID) | INTRAVENOUS | Status: DC
  Administered 2018-03-17: 20 mg via INTRAVENOUS
  Filled 2018-03-17 (×2): qty 50

## 2018-03-17 MED ORDER — VITAMIN D3 25 MCG (1000 UNIT) PO TABS
1000.0000 [IU] | ORAL_TABLET | Freq: Every day | ORAL | Status: DC
  Filled 2018-03-17 (×4): qty 1

## 2018-03-17 MED ORDER — SODIUM CHLORIDE 0.9 % IV SOLN
INTRAVENOUS | Status: DC | PRN
  Administered 2018-03-17: 14:00:00 via INTRAVENOUS

## 2018-03-17 MED ORDER — MIDAZOLAM HCL 2 MG/2ML IJ SOLN
0.5000 mg | Freq: Once | INTRAMUSCULAR | Status: AC
Start: 2018-03-17 — End: 2018-03-17
  Administered 2018-03-17: 0.5 mg via INTRAVENOUS

## 2018-03-17 MED ORDER — IPRATROPIUM-ALBUTEROL 0.5-2.5 (3) MG/3ML IN SOLN: 3.0000 mL | RESPIRATORY_TRACT | Status: DC

## 2018-03-17 MED ORDER — ACETAMINOPHEN 325 MG RE SUPP
325.0000 mg | RECTAL | Status: DC | PRN
  Filled 2018-03-17: qty 2

## 2018-03-17 MED ORDER — EPHEDRINE SULFATE 50 MG/ML IJ SOLN
INTRAMUSCULAR | Status: AC
  Filled 2018-03-17: qty 1

## 2018-03-17 MED ORDER — POTASSIUM CHLORIDE CRYS ER 20 MEQ PO TBCR: 20.0000 meq | EXTENDED_RELEASE_TABLET | Freq: Every day | ORAL | Status: DC | PRN

## 2018-03-17 MED ORDER — NITROGLYCERIN IN D5W 200-5 MCG/ML-% IV SOLN: 5.0000 ug/min | INTRAVENOUS | Status: DC

## 2018-03-17 MED ORDER — HEPARIN SODIUM (PORCINE) 1000 UNIT/ML IJ SOLN
INTRAMUSCULAR | Status: DC | PRN
  Administered 2018-03-17: 6000 [IU] via INTRAVENOUS

## 2018-03-17 MED ORDER — GUAIFENESIN-DM 100-10 MG/5ML PO SYRP
15.0000 mL | ORAL_SOLUTION | ORAL | Status: DC | PRN
  Filled 2018-03-17: qty 15

## 2018-03-17 MED ORDER — ONDANSETRON HCL 4 MG/2ML IJ SOLN: 4.0000 mg | Freq: Four times a day (QID) | INTRAMUSCULAR | Status: DC | PRN

## 2018-03-17 MED ORDER — ALBUTEROL SULFATE HFA 108 (90 BASE) MCG/ACT IN AERS: 2.0000 | INHALATION_SPRAY | Freq: Four times a day (QID) | RESPIRATORY_TRACT | Status: DC | PRN

## 2018-03-17 MED ORDER — PANTOPRAZOLE SODIUM 40 MG PO TBEC
40.0000 mg | DELAYED_RELEASE_TABLET | Freq: Every day | ORAL | Status: DC
  Administered 2018-03-18: 40 mg via ORAL
  Filled 2018-03-17: qty 1

## 2018-03-17 MED ORDER — FENTANYL CITRATE (PF) 100 MCG/2ML IJ SOLN
25.0000 ug | INTRAMUSCULAR | Status: DC | PRN
  Administered 2018-03-17: 25 ug via INTRAVENOUS

## 2018-03-17 MED ORDER — DOCUSATE SODIUM 100 MG PO CAPS
100.0000 mg | ORAL_CAPSULE | Freq: Every day | ORAL | Status: DC
  Filled 2018-03-17: qty 1

## 2018-03-17 MED ORDER — OMEGA-3-ACID ETHYL ESTERS 1 G PO CAPS
1.0000 | ORAL_CAPSULE | Freq: Every day | ORAL | Status: DC
  Filled 2018-03-17 (×2): qty 1

## 2018-03-17 MED ORDER — ALUM & MAG HYDROXIDE-SIMETH 200-200-20 MG/5ML PO SUSP
15.0000 mL | ORAL | Status: DC | PRN
  Filled 2018-03-17: qty 30

## 2018-03-17 MED ORDER — LABETALOL HCL 5 MG/ML IV SOLN: 10.0000 mg | INTRAVENOUS | Status: DC | PRN

## 2018-03-17 MED ORDER — CEFAZOLIN SODIUM-DEXTROSE 2-4 GM/100ML-% IV SOLN
2.0000 g | Freq: Three times a day (TID) | INTRAVENOUS | Status: AC
  Administered 2018-03-17 – 2018-03-18 (×2): 2 g via INTRAVENOUS
  Filled 2018-03-17 (×2): qty 100

## 2018-03-17 MED ORDER — PROPOFOL 10 MG/ML IV BOLUS
INTRAVENOUS | Status: AC
  Filled 2018-03-17: qty 20

## 2018-03-17 MED ORDER — REMIFENTANIL HCL 1 MG IV SOLR
INTRAVENOUS | Status: DC | PRN
  Administered 2018-03-17: .2 ug/kg/min via INTRAVENOUS

## 2018-03-17 MED ORDER — ALBUTEROL SULFATE (2.5 MG/3ML) 0.083% IN NEBU: 2.5000 mg | INHALATION_SOLUTION | Freq: Four times a day (QID) | RESPIRATORY_TRACT | Status: DC | PRN

## 2018-03-17 SURGICAL SUPPLY — 61 items
BAG DECANTER FOR FLEXI CONT (MISCELLANEOUS) ×3 IMPLANT
BLADE SURG 15 STRL LF DISP TIS (BLADE) ×1 IMPLANT
BLADE SURG 15 STRL SS (BLADE) ×2
BLADE SURG SZ11 CARB STEEL (BLADE) ×3 IMPLANT
BOOT SUTURE AID YELLOW STND (SUTURE) ×3 IMPLANT
BRUSH SCRUB EZ  4% CHG (MISCELLANEOUS) ×2
BRUSH SCRUB EZ 4% CHG (MISCELLANEOUS) ×1 IMPLANT
CANISTER SUCT 1200ML W/VALVE (MISCELLANEOUS) ×3 IMPLANT
DERMABOND ADVANCED (GAUZE/BANDAGES/DRESSINGS) ×2
DERMABOND ADVANCED .7 DNX12 (GAUZE/BANDAGES/DRESSINGS) ×1 IMPLANT
DRAPE INCISE IOBAN 66X45 STRL (DRAPES) ×3 IMPLANT
DRAPE LAPAROTOMY 77X122 PED (DRAPES) ×3 IMPLANT
DRAPE SHEET LG 3/4 BI-LAMINATE (DRAPES) ×3 IMPLANT
DRSG TEGADERM 4X4.75 (GAUZE/BANDAGES/DRESSINGS) IMPLANT
DRSG TELFA 3X8 NADH (GAUZE/BANDAGES/DRESSINGS) IMPLANT
DURAPREP 26ML APPLICATOR (WOUND CARE) ×3 IMPLANT
ELECT CAUTERY BLADE 6.4 (BLADE) ×3 IMPLANT
ELECT REM PT RETURN 9FT ADLT (ELECTROSURGICAL) ×3
ELECTRODE REM PT RTRN 9FT ADLT (ELECTROSURGICAL) ×1 IMPLANT
GLOVE BIO SURGEON STRL SZ7 (GLOVE) ×9 IMPLANT
GLOVE INDICATOR 7.5 STRL GRN (GLOVE) ×3 IMPLANT
GOWN STRL REUS W/ TWL LRG LVL3 (GOWN DISPOSABLE) ×2 IMPLANT
GOWN STRL REUS W/ TWL XL LVL3 (GOWN DISPOSABLE) ×2 IMPLANT
GOWN STRL REUS W/TWL LRG LVL3 (GOWN DISPOSABLE) ×4
GOWN STRL REUS W/TWL XL LVL3 (GOWN DISPOSABLE) ×4
HEMOSTAT SURGICEL 2X3 (HEMOSTASIS) ×3 IMPLANT
IV NS 250ML (IV SOLUTION) ×2
IV NS 250ML BAXH (IV SOLUTION) ×1 IMPLANT
KIT TURNOVER KIT A (KITS) ×3 IMPLANT
LABEL OR SOLS (LABEL) ×3 IMPLANT
LOOP RED MAXI  1X406MM (MISCELLANEOUS) ×4
LOOP VESSEL MAXI 1X406 RED (MISCELLANEOUS) ×2 IMPLANT
LOOP VESSEL MINI 0.8X406 BLUE (MISCELLANEOUS) ×1 IMPLANT
LOOPS BLUE MINI 0.8X406MM (MISCELLANEOUS) ×2
NEEDLE FILTER BLUNT 18X 1/2SAF (NEEDLE) ×2
NEEDLE FILTER BLUNT 18X1 1/2 (NEEDLE) ×1 IMPLANT
NEEDLE HYPO 25X1 1.5 SAFETY (NEEDLE) ×3 IMPLANT
NS IRRIG 1000ML POUR BTL (IV SOLUTION) ×3 IMPLANT
PACK BASIN MAJOR ARMC (MISCELLANEOUS) ×3 IMPLANT
PATCH CAROTID ECM VASC 1X10 (Prosthesis & Implant Heart) ×3 IMPLANT
PENCIL ELECTRO HAND CTR (MISCELLANEOUS) IMPLANT
SHUNT W TPORT 9FR PRUITT F3 (SHUNT) ×3 IMPLANT
SUT MNCRL 4-0 (SUTURE) ×2
SUT MNCRL 4-0 27XMFL (SUTURE) ×1
SUT PROLENE 6 0 BV (SUTURE) ×12 IMPLANT
SUT PROLENE 7 0 BV 1 (SUTURE) ×6 IMPLANT
SUT SILK 2 0 (SUTURE) ×2
SUT SILK 2-0 18XBRD TIE 12 (SUTURE) ×1 IMPLANT
SUT SILK 3 0 (SUTURE) ×2
SUT SILK 3-0 18XBRD TIE 12 (SUTURE) ×1 IMPLANT
SUT SILK 4 0 (SUTURE) ×2
SUT SILK 4-0 18XBRD TIE 12 (SUTURE) ×1 IMPLANT
SUT VIC AB 3-0 SH 27 (SUTURE) ×4
SUT VIC AB 3-0 SH 27X BRD (SUTURE) ×2 IMPLANT
SUTURE MNCRL 4-0 27XMF (SUTURE) ×1 IMPLANT
SYR 10ML LL (SYRINGE) ×6 IMPLANT
SYR 20CC LL (SYRINGE) ×3 IMPLANT
TOWEL OR 17X26 4PK STRL BLUE (TOWEL DISPOSABLE) ×3 IMPLANT
TRAY FOLEY MTR SLVR 16FR STAT (SET/KITS/TRAYS/PACK) ×3 IMPLANT
TUBING CONNECTING 10 (TUBING) IMPLANT
TUBING CONNECTING 10' (TUBING)

## 2018-03-17 NOTE — Anesthesia Postprocedure Evaluation (Deleted)
Anesthesia Post Note  Patient: Tanya Ryan  Procedure(s) Performed: ENDARTERECTOMY CAROTID (Right )  Patient location during evaluation: PACU Anesthesia Type: General Level of consciousness: awake and alert Pain management: pain level controlled Vital Signs Assessment: post-procedure vital signs reviewed and stable Respiratory status: spontaneous breathing, nonlabored ventilation and respiratory function stable Cardiovascular status: blood pressure returned to baseline and stable Postop Assessment: no apparent nausea or vomiting Anesthetic complications: no     Last Vitals:  Vitals:   03/17/18 1055  BP: (!) 150/81  Pulse: 69  Resp: 16  Temp: (!) 36 C  SpO2: 99%    Last Pain:  Vitals:   03/17/18 1055  TempSrc: Oral  PainSc: 0-No pain                 Durenda Hurt

## 2018-03-17 NOTE — Anesthesia Procedure Notes (Addendum)
Procedure Name: Intubation Date/Time: 03/17/2018 2:28 PM Performed by: Rolla Plate, CRNA Pre-anesthesia Checklist: Patient identified, Patient being monitored, Timeout performed, Emergency Drugs available and Suction available Patient Re-evaluated:Patient Re-evaluated prior to induction Oxygen Delivery Method: Circle system utilized Preoxygenation: Pre-oxygenation with 100% oxygen Induction Type: IV induction Ventilation: Mask ventilation without difficulty Laryngoscope Size: Mac and 3 Grade View: Grade I Tube type: Oral Tube size: 7.0 mm Number of attempts: 1 Airway Equipment and Method: Stylet Placement Confirmation: ETT inserted through vocal cords under direct vision,  positive ETCO2 and breath sounds checked- equal and bilateral Secured at: 21 cm Tube secured with: Tape Dental Injury: Teeth and Oropharynx as per pre-operative assessment

## 2018-03-17 NOTE — Op Note (Signed)
[facility]   OPERATIVE NOTE  PROCEDURE:   1.  Right carotid endarterectomy with CorMatrix arterial patch reconstruction  PRE-OPERATIVE DIAGNOSIS: 1.  Right carotid stenosis   POST-OPERATIVE DIAGNOSIS: same as above   SURGEON: Leotis Pain, MD  ASSISTANT(S): none  ANESTHESIA: general  ESTIMATED BLOOD LOSS: 20 cc  FINDING(S): 1.  Right carotid plaque.  SPECIMEN(S):  Carotid plaque (sent to Pathology)  INDICATIONS:   Tanya Ryan is a 67 y.o. female who presents with right carotid stenosis of >75%.  I discussed with the patient the risks, benefits, and alternatives to carotid endarterectomy.  I discussed the differences between carotid stenting and carotid endarterectomy. I discussed the procedural details of carotid endarterectomy with the patient.  The patient is aware that the risks of carotid endarterectomy include but are not limited to: bleeding, infection, stroke, myocardial infarction, death, cranial nerve injuries both temporary and permanent, neck hematoma, possible airway compromise, labile blood pressure post-operatively, cerebral hyperperfusion syndrome, and possible need for additional interventions in the future. The patient is aware of the risks and agrees to proceed forward with the procedure.  DESCRIPTION: After full informed written consent was obtained from the patient, the patient was brought back to the operating room and placed supine upon the operating table.  Prior to induction, the patient received IV antibiotics.  After obtaining adequate anesthesia, the patient was placed into a modified beach chair position with a shoulder roll in place and the patient's neck slightly hyperextended and rotated away from the surgical site.  The patient was prepped in the standard fashion for a carotid endarterectomy.  I made an incision anterior to the sternocleidomastoid muscle and dissected down through the subcutaneous tissue.  The platysmas was  opened with electrocautery.  Then I dissected down to the internal jugular vein and facial vein.  The facial vein is ligated and divided between 2-0 silk ties.  This was dissected posteriorly until I obtained visualization of the common carotid artery.  This was dissected out and then a vessel loop was placed around the common carotid artery.  I then dissected in a periadventitial fashion along the common carotid artery up to the bifurcation.  I then identified the external carotid artery and the superior thyroid artery.  I placed a vessel loop around the superior thyroid artery, and I also dissected out the external carotid artery and placed a vessel loop around it. In the process of this dissection, the hypoglossal nerve was identified and protected from harm.  I then dissected out the internal carotid artery until I identified an area in the internal carotid artery clearly above the stenosis.  I dissected slightly distal to this area, and placed a vessel loop around the artery.  At this point, we gave the patient 6000 units of intravenous heparin.  After this was allowed to circulate for several minutes, I pulled up control on the vessel loops to clamp the internal carotid artery, external carotid artery, superior thyroid artery, and then the common carotid artery.  I then made an arteriotomy in the common carotid artery with a 11 blade, and extended the arteriotomy with a Potts scissor down into the common carotid artery, then I carried the arteriotomy through the bifurcation into the internal carotid artery until I reached an area that was not diseased.  At this point, I took the Pruitt-In[place]ara shunt that previously been prepared and I inserted it into the internal carotid artery first, and then into the common carotid artery taking  care to flush and de-air prior to release of control. At this point, I started the endarterectomy in the common carotid artery with a Penfield elevator and carried this dissection  down into the common carotid artery circumferentially.  Then I transected the plaque at a segment where it was adherent and transected the plaque with Potts scissors.  I then carried this dissection up into the external carotid artery.  The plaque was extracted by unclamping the external carotid artery and performing an eversion endarterectomy.  The dissection was then carried into the internal carotid artery where a nice feathered end point was created with gentle traction.  I passed the plaque off the field as a specimen. At this point I removed all loose flecks and remaining disease possible.  At this point, I was satisfied that the minimal remaining disease was densely adherent to the wall and wall integrity was intact. The distal endpoint was tacked down with two 7-0 Prolene sutures.  I then fashioned a CorMatrix arterial patch for the artery and sewed it in place with two running stitch of 6-0 Prolene.  I started at the distal endpoint and ran one half the length of the arteriotomy.  I then cut and beveled the patch to an appropriate length to match the arteriotomy.  I started the second 6-0 Prolene at the proximal end point.  The medial suture line was completed and the lateral suture line was run approximately one quarter the length of the arteriotomy.  Prior to completing this patch angioplasty, I removed the shunt first from the internal carotid artery, from which there was excellent backbleeding, and clamped it.  Then I removed the shunt from the common carotid artery, from which there was excellent antegrade bleeding, and then clamped it.  At this point, I allowed the external carotid artery to backbleed, which was excellent.  Then I instilled heparinized saline in this patched artery and then completed the patch angioplasty in the usual fashion.  First, I released the clamp on the external carotid artery, then I released it on the common carotid artery.  After waiting a few seconds, I then released it  on the internal carotid artery. Several minutes of pressure were held and 6-0 Prolene patch sutures were used as need for hemostasis.  At this point, I placed Surgicel and Evicel topical hemostatic agents.  There was no more active bleeding in the surgical site.  The sternocleidomastoid space was closed with three interrupted 3-0 Vicryl sutures. I then reapproximated the platysma muscle with a running stitch of 3-0 Vicryl.  The skin was then closed with a running subcuticular 4-0 Monocryl.  The skin was then cleaned, dried and Dermabond was used to reinforce the skin closure.  The patient awakened and was taken to the recovery room in stable condition, following commands and moving all four extremities without any apparent deficits.    COMPLICATIONS: none  CONDITION: stable  Leotis Pain  03/17/2018, 4:35 PM    This note was created with Dragon Medical transcription system. Any errors in dictation are purely unintentional.

## 2018-03-17 NOTE — Progress Notes (Signed)
Dr. Marcello Moores came to see pt as she stated she was having a hard time breathing. Pt tachypnic and has decreased breath sounds. Dr. Marcello Moores acknowledged. Orders received. Tanya Ryan E 5:17 PM 03/17/2018

## 2018-03-17 NOTE — Anesthesia Post-op Follow-up Note (Signed)
Anesthesia QCDR form completed.        

## 2018-03-17 NOTE — Anesthesia Postprocedure Evaluation (Signed)
Anesthesia Post Note  Patient: Tanya Ryan  Procedure(s) Performed: ENDARTERECTOMY CAROTID (Right )  Patient location during evaluation: PACU Anesthesia Type: General Level of consciousness: awake and alert Pain management: pain level controlled Vital Signs Assessment: post-procedure vital signs reviewed and stable Respiratory status: spontaneous breathing, nonlabored ventilation, respiratory function stable and patient connected to nasal cannula oxygen Cardiovascular status: blood pressure returned to baseline and stable Postop Assessment: no apparent nausea or vomiting Anesthetic complications: no     Last Vitals:  Vitals:   03/17/18 1750 03/17/18 1801  BP: (!) 142/64 (!) 142/64  Pulse: 78 76  Resp: 18 19  Temp:  36.8 C  SpO2: 94% 95%    Last Pain:  Vitals:   03/17/18 1801  TempSrc:   PainSc: Dailey S

## 2018-03-17 NOTE — Anesthesia Procedure Notes (Signed)
Arterial Line Insertion Performed by: Durenda Hurt, MD, Rolla Plate, CRNA, anesthesiologist, CRNA  Patient location: Pre-op. Preanesthetic checklist: patient identified, IV checked, site marked, risks and benefits discussed, surgical consent, monitors and equipment checked, pre-op evaluation, timeout performed and anesthesia consent Patient sedated Left, radial was placed Catheter size: 20 Fr Hand hygiene performed , maximum sterile barriers used  and Seldinger technique used  Attempts: 2 Procedure performed using ultrasound guided technique. Ultrasound Notes:anatomy identified, needle tip was noted to be adjacent to the nerve/plexus identified and no ultrasound evidence of intravascular and/or intraneural injection Following insertion, dressing applied. Post procedure assessment: normal and unchanged  Patient tolerated the procedure well with no immediate complications.

## 2018-03-17 NOTE — Progress Notes (Signed)
PHARMACIST - PHYSICIAN ORDER COMMUNICATION  CONCERNING: P&T Medication Policy on Herbal Medications  DESCRIPTION:  This patient's order for:  Vit C-Cholecalciferol-Rose Hip 680-3212-24 MG-UNIT-MG CAPS 1 tablet New      has been noted.  This product(s) is classified as an "herbal" or natural product. Due to a lack of definitive safety studies or FDA approval, nonstandard manufacturing practices, plus the potential risk of unknown drug-drug interactions while on inpatient medications, the Pharmacy and Therapeutics Committee does not permit the use of "herbal" or natural products of this type within [facility].   ACTION TAKEN: The pharmacy department is unable to verify this order at this timePlease reevaluate patient's clinical condition at discharge and address if the herbal or natural product(s) should be resumed at that time.

## 2018-03-17 NOTE — H&P (Signed)
[facility] Admission History & Physical  MRN : [id number]  Tanya Ryan is a 67 y.o. (September 02, 1950) female who presents with chief complaint of No chief complaint on file. Marland Kitchen  History of Present Illness: Patient presents for elective right carotid endarterectomy.  She did not have any focal neurologic symptoms. Specifically, the patient denies amaurosis fugax, speech or swallowing difficulties, or arm or leg weakness or numbness.  She had a CT scan performed by her primary care physician for a lump in her left posterior neck.  The CT was unrevealing of the source of the lump, but an incidental finding of high-grade right carotid artery stenosis was identified.  I have reviewed the study and agree with that assessment.  Current Facility-Administered Medications  Medication Dose Route Frequency Provider Last Rate Last Dose  . 0.9 %  sodium chloride infusion   Intravenous Continuous Gunnar Fusi, MD 100 mL/hr at 03/17/18 1133    . ceFAZolin (ANCEF) 2-4 GM/100ML-% IVPB           . ceFAZolin (ANCEF) IVPB 2g/100 mL premix  2 g Intravenous On Call to [place], PA-C      . Chlorhexidine Gluconate Cloth 2 % PADS 6 each  6 each Topical Once Stegmayer, Kimberly A, PA-C       And  . Chlorhexidine Gluconate Cloth 2 % PADS 6 each  6 each Topical Once Stegmayer, Janalyn Harder, PA-C        Past Medical History:  Diagnosis Date  . Anemia   . Anxiety   . Asthma   . Cancer ([place])    skin. on face  . Complication of anesthesia 1990   pt woke up in middle of bunionectomy.   Marland Kitchen COPD (chronic obstructive pulmonary disease) ([place])   . Depression   . Diabetes mellitus without complication ([place])   . Dyspnea   . Dysrhythmia 01/2018   LBBB  . GERD (gastroesophageal reflux disease)   . History of broken nose 1970  . Restless leg syndrome     Past Surgical History:  Procedure Laterality Date  . BUNIONECTOMY Bilateral 1990  . COLONOSCOPY  [id number]  . RADIAL  HEAD ARTHROPLASTY Left 08/10/2017   Procedure: RADIAL HEAD ARTHROPLASTY;  Surgeon: Corky Mull, MD;  Location: [facility] ORS;  Service: Orthopedics;  Laterality: Left;  . TUBAL LIGATION  1977  . YAG LASER APPLICATION Bilateral [id number]    Social History Social History   Tobacco Use  . Smoking status: Former Smoker    Packs/day: 1.50    Years: 45.00    Pack years: 67.50    Types: Cigarettes    Last attempt to quit: 08/04/2015    Years since quitting: 2.6  . Smokeless tobacco: Never Used  Substance Use Topics  . Alcohol use: Yes    Frequency: Never    Comment: socially  . Drug use: No    Family History Family History  Problem Relation Age of Onset  . Breast cancer Neg Hx   no bleeding disorders, clotting disorders, or aneurysms  Allergies  Allergen Reactions  . Metformin Diarrhea     REVIEW OF SYSTEMS (Negative unless checked)  Constitutional: [] Weight loss  [] Fever  [] Chills Cardiac: [] Chest pain   [] Chest pressure   [x] Palpitations   [] Shortness of breath when laying flat   [] Shortness of breath at rest   [x] Shortness of breath with exertion. Vascular:  [] Pain in legs with walking   [] Pain in legs at rest   []   Pain in legs when laying flat   [] Claudication   [] Pain in feet when walking  [] Pain in feet at rest  [] Pain in feet when laying flat   [] History of DVT   [] Phlebitis   [] Swelling in legs   [] Varicose veins   [] Non-healing ulcers Pulmonary:   [] Uses home oxygen   [] Productive cough   [] Hemoptysis   [] Wheeze  [x] COPD   [x] Asthma Neurologic:  [] Dizziness  [] Blackouts   [] Seizures   [] History of stroke   [] History of TIA  [] Aphasia   [] Temporary blindness   [] Dysphagia   [] Weakness or numbness in arms   [] Weakness or numbness in legs Musculoskeletal:  [] Arthritis   [] Joint swelling   [] Joint pain   [] Low back pain Hematologic:  [] Easy bruising  [] Easy bleeding   [] Hypercoagulable state   [x] Anemic  [] Hepatitis Gastrointestinal:  [] Blood in stool   [] Vomiting blood   [x] Gastroesophageal reflux/heartburn   [] Difficulty swallowing. Genitourinary:  [] Chronic kidney disease   [] Difficult urination  [] Frequent urination  [] Burning with urination   [] Blood in urine Skin:  [] Rashes   [] Ulcers   [] Wounds Psychological:  [x] History of anxiety   []  History of major depression.  Physical Examination  Vitals:   03/17/18 1055  BP: (!) 150/81  Pulse: 69  Resp: 16  Temp: (!) 96.8 F (36 C)  TempSrc: Oral  SpO2: 99%  Weight: 88.5 kg  Height: 5\' 3"  (1.6 m)   Body mass index is 34.56 kg/m. Gen: WD/WN, NAD Head: [place]/AT, No temporalis wasting Ear/Nose/Throat: Hearing grossly intact, nares w/o erythema or drainage, oropharynx w/o Erythema/Exudate,  Eyes: Conjunctiva clear, sclera non-icteric Neck: Trachea midline.  No JVD.  Pulmonary:  Good air movement, respirations not labored, no use of accessory muscles.  Cardiac: RRR, normal S1, S2. Vascular:  Vessel Right Left  Radial Palpable Palpable                                    Musculoskeletal: M/S 5/5 throughout.  Extremities without ischemic changes.  No deformity or atrophy.  Neurologic: Sensation grossly intact in extremities.  Symmetrical.  Speech is fluent. Motor exam as listed above. Psychiatric: Judgment intact, Mood & affect appropriate for pt's clinical situation. Dermatologic: No rashes or ulcers noted.  No cellulitis or open wounds.      CBC Lab Results  Component Value Date   WBC 8.4 03/10/2018   HGB 13.7 03/10/2018   HCT 40.0 03/10/2018   MCV 86.9 03/10/2018   PLT 257 03/10/2018    BMET    Component Value Date/Time   NA 142 03/10/2018 0929   K 3.8 03/10/2018 0929   CL 107 03/10/2018 0929   CO2 27 03/10/2018 0929   GLUCOSE 110 (H) 03/10/2018 0929   BUN 19 03/10/2018 0929   CREATININE 0.73 03/10/2018 0929   CALCIUM 9.5 03/10/2018 0929   GFRNONAA >60 03/10/2018 0929   GFRAA >60 03/10/2018 0929   Estimated Creatinine Clearance: 72.9 mL/min (by C-G formula based on SCr  of 0.73 mg/dL).  COAG Lab Results  Component Value Date   INR 1.00 03/10/2018    Radiology No results found.    Assessment/Plan 1.  High-grade right carotid artery stenosis.  Right carotid endarterectomy being performed for stroke risk reduction.  Risks and benefits of the procedure were discussed in detail with the patient and her daughter and she is agreeable to proceed 2.  Diabetes.  Stable on outpatient  medications and blood glucose control important in reducing the progression of atherosclerotic disease. Also, involved in wound healing. On appropriate medications. 3.  History of dysrhythmia.  Monitor telemetry throughout and after the procedure.  Will be watched in the ICU overnight   Leotis Pain, MD  03/17/2018 11:57 AM

## 2018-03-17 NOTE — Anesthesia Preprocedure Evaluation (Addendum)
Anesthesia Evaluation  Patient identified by MRN, date of birth, ID band Patient awake    Reviewed: Allergy & Precautions, H&P , NPO status , Patient's Chart, lab work & pertinent test results, reviewed documented beta blocker date and time   History of Anesthesia Complications Negative for: history of anesthetic complications  Airway Mallampati: III  TM Distance: >3 FB Neck ROM: full    Dental  (+) Caps, Dental Advidsory Given Permanent bridge on top left:   Pulmonary shortness of breath and with exertion, COPD,  COPD inhaler, neg recent URI, former smoker,    breath sounds clear to auscultation       Cardiovascular Exercise Tolerance: Good + Peripheral Vascular Disease  negative cardio ROS  + dysrhythmias  Rhythm:Regular Rate:Normal     Neuro/Psych PSYCHIATRIC DISORDERS Anxiety Depression negative neurological ROS     GI/Hepatic Neg liver ROS, GERD  ,  Endo/Other  diabetes  Renal/GU negative Renal ROS  negative genitourinary   Musculoskeletal   Abdominal   Peds  Hematology negative hematology ROS (+) anemia ,   Anesthesia Other Findings Past Medical History: No date: Anxiety No date: Cancer [facility])     Comment:  skin No date: COPD (chronic obstructive pulmonary disease) (H[facility]) No date: Diabetes mellitus without complication (H[facility]) No date: GERD (gastroesophageal reflux disease) 1970: History of broken nose No date: Restless leg syndrome   Reproductive/Obstetrics negative OB ROS                            Anesthesia Physical  Anesthesia Plan  ASA: III  Anesthesia Plan: General   Post-op Pain Management:    Induction: Intravenous  PONV Risk Score and Plan: 3 and Ondansetron and Dexamethasone  Airway Management Planned: Oral ETT  Additional Equipment:   Intra-op Plan:   Post-operative Plan: Extubation in OR  Informed Consent: I have reviewed the patients History and  Physical, chart, labs and discussed the procedure including the risks, benefits and alternatives for the proposed anesthesia with the patient or authorized representative who has indicated his/her understanding and acceptance.   Dental Advisory Given  Plan Discussed with: Anesthesiologist, CRNA and Surgeon  Anesthesia Plan Comments:         Anesthesia Quick Evaluation

## 2018-03-17 NOTE — Transfer of Care (Signed)
Immediate Anesthesia Transfer of Care Note  Patient: Tanya Ryan  Procedure(s) Performed: ENDARTERECTOMY CAROTID (Right )  Patient Location: PACU  Anesthesia Type:General  Level of Consciousness: awake and alert   Airway & Oxygen Therapy: Patient Spontanous Breathing and Patient connected to face mask oxygen  Post-op Assessment: Report given to RN and Post -op Vital signs reviewed and stable  Post vital signs: Reviewed  Last Vitals:  Vitals Value Taken Time  BP 155/65 03/17/2018  4:55 PM  Temp    Pulse 77 03/17/2018  4:56 PM  Resp 19 03/17/2018  4:56 PM  SpO2 96 % 03/17/2018  4:56 PM  Vitals shown include unvalidated device data.  Last Pain:  Vitals:   03/17/18 1055  TempSrc: Oral  PainSc: 0-No pain         Complications: No apparent anesthesia complications

## 2018-03-18 ENCOUNTER — Encounter: Payer: Self-pay | Admitting: Vascular Surgery

## 2018-03-18 LAB — BASIC METABOLIC PANEL
ANION GAP: 8 (ref 5–15)
BUN: 17 mg/dL (ref 8–23)
CO2: 26 mmol/L (ref 22–32)
CREATININE: 0.89 mg/dL (ref 0.44–1.00)
Calcium: 8.6 mg/dL — ABNORMAL LOW (ref 8.9–10.3)
Chloride: 111 mmol/L (ref 98–111)
GFR calc Af Amer: >60 mL/min (ref >60)
GLUCOSE: 206 mg/dL — AB (ref 70–99)
Potassium: 5.1 mmol/L (ref 3.5–5.1)
Sodium: 145 mmol/L (ref 135–145)

## 2018-03-18 LAB — CBC
HEMATOCRIT: 37.3 % (ref 35.0–47.0)
Hemoglobin: 12.4 g/dL (ref 12.0–16.0)
MCH: 29.9 pg (ref 26.0–34.0)
MCHC: 33.2 g/dL (ref 32.0–36.0)
MCV: 90 fL (ref 80.0–100.0)
PLATELETS: 233 10*3/uL (ref 150–440)
RBC: 4.15 MIL/uL (ref 3.80–5.20)
RDW: 13.8 % (ref 11.5–14.5)
WBC: 13.1 10*3/uL — ABNORMAL HIGH (ref 3.6–11.0)

## 2018-03-18 MED ORDER — ASPIRIN 81 MG PO TBEC
81.0000 mg | DELAYED_RELEASE_TABLET | Freq: Every day | ORAL | 5 refills | Status: DC
Start: 2018-03-19 — End: 2018-09-05

## 2018-03-18 MED ORDER — ATORVASTATIN CALCIUM 10 MG PO TABS: 10.0000 mg | ORAL_TABLET | Freq: Every day | ORAL | 3 refills | Status: AC

## 2018-03-18 MED ORDER — FAMOTIDINE 20 MG PO TABS: 20.0000 mg | ORAL_TABLET | Freq: Two times a day (BID) | ORAL | Status: DC

## 2018-03-18 MED ORDER — CLOPIDOGREL BISULFATE 75 MG PO TABS: 75.0000 mg | ORAL_TABLET | Freq: Every day | ORAL | 5 refills | Status: DC

## 2018-03-18 NOTE — Discharge Summary (Signed)
[facility]    Discharge Summary    Patient ID:  Tanya Ryan MRN: [id number] DOB/AGE: 01-08-1951 67 y.o.  Admit date: 03/17/2018 Discharge date: 03/18/2018 Date of Surgery: 03/17/2018 Surgeon: Surgeon(s): Lucky Cowboy Erskine Squibb, MD  Admission Diagnosis: CAROTID ARTERY STENOSIS  Discharge Diagnoses:  CAROTID ARTERY STENOSIS  Secondary Diagnoses: Past Medical History:  Diagnosis Date  . Anemia   . Anxiety   . Asthma   . Cancer ([place])    skin. on face  . Complication of anesthesia 1990   pt woke up in middle of bunionectomy.   Marland Kitchen COPD (chronic obstructive pulmonary disease) ([place])   . Depression   . Diabetes mellitus without complication ([place])   . Dyspnea   . Dysrhythmia 01/2018   LBBB  . GERD (gastroesophageal reflux disease)   . History of broken nose 1970  . Restless leg syndrome     Procedure(s): ENDARTERECTOMY CAROTID  Discharged Condition: good  HPI:  High grade right carotid stenosis, here for CEA  Hospital Course:  Tanya Ryan is a 67 y.o. female is S/P Right Procedure(s): ENDARTERECTOMY CAROTID Extubated: POD # 0 Physical exam: neuro exam intact Post-op wounds erythematous or healing well Pt. Ambulating, voiding and taking PO diet without difficulty. Pt pain controlled with PO pain meds. Labs as below Complications:none  Consults:    Significant Diagnostic Studies: CBC Lab Results  Component Value Date   WBC 13.1 (H) 03/18/2018   HGB 12.4 03/18/2018   HCT 37.3 03/18/2018   MCV 90.0 03/18/2018   PLT 233 03/18/2018    BMET    Component Value Date/Time   NA 145 03/18/2018 0650   K 5.1 03/18/2018 0650   CL 111 03/18/2018 0650   CO2 26 03/18/2018 0650   GLUCOSE 206 (H) 03/18/2018 0650   BUN 17 03/18/2018 0650   CREATININE 0.89 03/18/2018 0650   CALCIUM 8.6 (L) 03/18/2018 0650   GFRNONAA >60 03/18/2018 0650   GFRAA >60 03/18/2018 0650   COAG Lab Results  Component Value Date   INR 1.00 03/10/2018      Disposition:  Discharge to :Home Discharge Instructions    Call MD for:  redness, tenderness, or signs of infection (pain, swelling, bleeding, redness, odor or green/yellow discharge around incision site)   Complete by:  As directed    Call MD for:  severe or increased pain, loss or decreased feeling  in affected limb(s)   Complete by:  As directed    Call MD for:  temperature >100.5   Complete by:  As directed    Driving Restrictions   Complete by:  As directed    No driving for 48 hours   Lifting restrictions   Complete by:  As directed    No lifting for 48 hours   No dressing needed   Complete by:  As directed    Replace only if drainage present   Resume previous diet   Complete by:  As directed      Allergies as of 03/18/2018      Reactions   Metformin Diarrhea      Medication List    STOP taking these medications   oxyCODONE 5 MG immediate release tablet Commonly known as:  Oxy IR/ROXICODONE     TAKE these medications   ALPRAZolam 0.25 MG tablet Commonly known as:  XANAX Take 0.25 mg by mouth 2 (two) times daily as needed for anxiety.   aspirin 81 MG EC tablet Take 1 tablet (81  mg total) by mouth daily. Start taking on:  03/19/2018   atorvastatin 10 MG tablet Commonly known as:  LIPITOR Take 1 tablet (10 mg total) by mouth daily at 6 PM.   b complex vitamins tablet Take 1 tablet by mouth daily.   calcium-vitamin D 500-200 MG-UNIT tablet Commonly known as:  OSCAL WITH D Take 1 tablet by mouth daily with breakfast.   cholecalciferol 1000 units tablet Commonly known as:  VITAMIN D Take 1,000 Units by mouth daily.   clopidogrel 75 MG tablet Commonly known as:  PLAVIX Take 1 tablet (75 mg total) by mouth daily with breakfast. Start taking on:  03/19/2018   Fish Oil 1000 MG Caps Take 1 capsule by mouth daily.   Fluticasone-Salmeterol 250-50 MCG/DOSE Aepb Commonly known as:  ADVAIR Inhale 1 puff into the lungs 2 (two) times daily.   glimepiride  2 MG tablet Commonly known as:  AMARYL Take by mouth.   lidocaine-hydrocortisone 3-0.5 % Crea Commonly known as:  ANAMANTEL HC Apply topically. Place in outer ear as needed (for psoriasis)   loratadine 10 MG tablet Commonly known as:  CLARITIN Take 10 mg by mouth daily.   naproxen sodium 220 MG tablet Commonly known as:  ALEVE Take 220 mg by mouth 2 (two) times daily as needed.   omeprazole 20 MG capsule Commonly known as:  PRILOSEC Take 20 mg by mouth daily.   pramipexole 0.25 MG tablet Commonly known as:  MIRAPEX Take by mouth. Takes half a tablet in the morning and a whole tablet at bedtime.   PROVENTIL HFA 108 (90 Base) MCG/ACT inhaler Generic drug:  albuterol Inhale 2 puffs into the lungs every 6 (six) hours as needed (rescue inhaler).   pyridoxine 100 MG tablet Commonly known as:  B-6 Take 200 mg by mouth daily.   sertraline 100 MG tablet Commonly known as:  ZOLOFT Take 50 mg by mouth daily. Takes half a tablet in the morning   sitaGLIPtin 50 MG tablet Commonly known as:  JANUVIA Take 50 mg by mouth daily.   vitamin B-12 1000 MCG tablet Commonly known as:  CYANOCOBALAMIN Take 1,000 mcg by mouth daily.   VITAMIN C & D3/ROSE HIPS [phone] MG-UNIT-MG Caps Generic drug:  Vit C-Cholecalciferol-Rose Hip Take 1 tablet by mouth daily.   Vitamin D (Ergocalciferol) 50000 units Caps capsule Commonly known as:  DRISDOL Take 50,000 Units by mouth every 7 (seven) days. On sunday   vitamin E 400 UNIT capsule Take 400 Units by mouth daily.      Verbal and written Discharge instructions given to the patient. Wound care per Discharge AVS Follow-up Information    Kris Hartmann, NP Follow up in 3 week(s).   Specialty:  Nurse Practitioner Why:  for wound check Contact information: [place] [address] [phone]           Signed: Leotis Pain, MD  03/18/2018, 2:16 PM

## 2018-03-18 NOTE — Progress Notes (Signed)
[facility]  Daily Progress Note   Subjective  - 1 Day Post-Op  Doing well. Ready to go home  Objective Vitals:   03/18/18 0700 03/18/18 0800 03/18/18 1118 03/18/18 1200  BP: (!) 116/59 (!) 119/50 (!) 123/57   Pulse: 79 76 71   Resp: 19 18 (!) 23   Temp:  98.1 F (36.7 C)  98.3 F (36.8 C)  TempSrc:  Oral  Oral  SpO2: 93% 96% 96%   Weight:      Height:        Intake/Output Summary (Last 24 hours) at 03/18/2018 1411 Last data filed at 03/18/2018 1350 Gross per 24 hour  Intake 2730 ml  Output 1740 ml  Net 990 ml    PULM  CTAB CV  RRR VASC  Neck with no swelling, minimal bruising. Neuro exam intact  Laboratory CBC    Component Value Date/Time   WBC 13.1 (H) 03/18/2018 0650   HGB 12.4 03/18/2018 0650   HCT 37.3 03/18/2018 0650   PLT 233 03/18/2018 0650    BMET    Component Value Date/Time   NA 145 03/18/2018 0650   K 5.1 03/18/2018 0650   CL 111 03/18/2018 0650   CO2 26 03/18/2018 0650   GLUCOSE 206 (H) 03/18/2018 0650   BUN 17 03/18/2018 0650   CREATININE 0.89 03/18/2018 0650   CALCIUM 8.6 (L) 03/18/2018 0650   GFRNONAA >60 03/18/2018 0650   GFRAA >60 03/18/2018 0650    Assessment/Planning: POD #1 s/p right CEA   D/C home    Leotis Pain  03/18/2018, 2:11 PM

## 2018-03-18 NOTE — Progress Notes (Signed)
Discharge instructions and prescriptions reviewed with and given to patient.  Patient stated that she understood all the information- IV removed.  Patient discharged by wheelchair and auxillary.  Daughter is driving patient home.

## 2018-03-18 NOTE — Progress Notes (Signed)
Discharge instructions and prescriptions reviewed with patient and daughter, any questions answered. IV removed, catheter intact. VS WNL. Patient taken down to daughter's vehicle by volunteer via wheelchair.

## 2018-03-21 LAB — SURGICAL PATHOLOGY

## 2018-04-11 ENCOUNTER — Ambulatory Visit (INDEPENDENT_AMBULATORY_CARE_PROVIDER_SITE_OTHER): Payer: BLUE CROSS/BLUE SHIELD | Admitting: Vascular Surgery

## 2018-04-11 ENCOUNTER — Encounter (INDEPENDENT_AMBULATORY_CARE_PROVIDER_SITE_OTHER): Payer: Self-pay | Admitting: Vascular Surgery

## 2018-04-11 VITALS — BP 132/70 | HR 70 | Resp 14 | Ht 64.0 in | Wt 196.0 lb

## 2018-04-11 DIAGNOSIS — I6521 Occlusion and stenosis of right carotid artery: Secondary | ICD-10-CM

## 2018-04-11 DIAGNOSIS — E785 Hyperlipidemia, unspecified: Secondary | ICD-10-CM

## 2018-04-11 DIAGNOSIS — E119 Type 2 diabetes mellitus without complications: Secondary | ICD-10-CM

## 2018-04-11 NOTE — Progress Notes (Signed)
Subjective:    Patient ID: Tanya Ryan, female    DOB: 14-Mar-1951, 67 y.o.   MRN: [id number] Chief Complaint  Patient presents with  . Follow-up    3 week wound check   Patient presents for her first post to follow-up.  The patient is status post a right carotid endarterectomy on March 17, 2018.  The patient presents today with an unremarkable postoperative course.  Patient presents today without complaint.  The patient denies any pain.  Denies any issues with her incision. The patient denies experiencing Amaurosis Fugax, TIA like symptoms or focal motor deficits.  Patient denies any fever, nausea vomiting.  Review of Systems  Constitutional: Negative.   HENT: Negative.   Eyes: Negative.   Respiratory: Negative.   Cardiovascular: Negative.   Gastrointestinal: Negative.   Endocrine: Negative.   Genitourinary: Negative.   Musculoskeletal: Negative.   Skin: Negative.   Allergic/Immunologic: Negative.   Neurological: Negative.   Hematological: Negative.   Psychiatric/Behavioral: Negative.       Objective:   Physical Exam  Constitutional: She is oriented to person, place, and time. She appears well-developed and well-nourished. No distress.  HENT:  Head: Normocephalic and atraumatic.  Right Ear: External ear normal.  Left Ear: External ear normal.  Eyes: Pupils are equal, round, and reactive to light. Conjunctivae and EOM are normal.  Neck: Normal range of motion.  Right carotid endarterectomy incision is healing well.  No bruit or thrill noted on exam.  Neck is not swollen.  Trachea is midline.  Cardiovascular: Normal rate, regular rhythm, normal heart sounds and intact distal pulses.  Pulses:      Radial pulses are 2+ on the right side, and 2+ on the left side.  Pulmonary/Chest: Effort normal and breath sounds normal.  Musculoskeletal: Normal range of motion. She exhibits no edema.  Neurological: She is alert and oriented to person, place, and time.  Skin: Skin is  warm and dry. She is not diaphoretic.  Psychiatric: She has a normal mood and affect. Her behavior is normal. Judgment and thought content normal.  Vitals reviewed.  BP 132/70 (BP Location: Right Arm, Patient Position: Sitting)   Pulse 70   Resp 14   Ht 5\' 4"  (1.626 m)   Wt 196 lb (88.9 kg)   BMI 33.64 kg/m   Past Medical History:  Diagnosis Date  . Anemia   . Anxiety   . Asthma   . Cancer ([place])    skin. on face  . Complication of anesthesia 1990   pt woke up in middle of bunionectomy.   Marland Kitchen COPD (chronic obstructive pulmonary disease) ([place])   . Depression   . Diabetes mellitus without complication ([place])   . Dyspnea   . Dysrhythmia 01/2018   LBBB  . GERD (gastroesophageal reflux disease)   . History of broken nose 1970  . Restless leg syndrome    Social History   Socioeconomic History  . Marital status: Divorced    Spouse name: Not on file  . Number of children: 3  . Years of education: Not on file  . Highest education level: 12th grade  Occupational History  . Occupation: Glass blower/designer  Social Needs  . Financial resource strain: Not on file  . Food insecurity:    Worry: Never true    Inability: Not on file  . Transportation needs:    Medical: No    Non-medical: Not on file  Tobacco Use  . Smoking status: Former Smoker  Packs/day: 1.50    Years: 45.00    Pack years: 67.50    Types: Cigarettes    Last attempt to quit: 08/04/2015    Years since quitting: 2.6  . Smokeless tobacco: Never Used  Substance and Sexual Activity  . Alcohol use: Yes    Frequency: Never    Comment: socially  . Drug use: No  . Sexual activity: Not on file  Lifestyle  . Physical activity:    Days per week: 0 days    Minutes per session: Not on file  . Stress: Rather much  Relationships  . Social connections:    Talks on phone: Never    Gets together: Twice a week    Attends religious service: Never    Active member of club or organization: Yes    Attends meetings of clubs  or organizations: 1 to 4 times per year    Relationship status: Divorced  . Intimate partner violence:    Fear of current or ex partner: No    Emotionally abused: No    Physically abused: No    Forced sexual activity: No  Other Topics Concern  . Not on file  Social History Narrative  . Not on file   Past Surgical History:  Procedure Laterality Date  . BUNIONECTOMY Bilateral 1990  . COLONOSCOPY  [id number]  . ENDARTERECTOMY Right 03/17/2018   Procedure: ENDARTERECTOMY CAROTID;  Surgeon: Algernon Huxley, MD;  Location: [facility] ORS;  Service: Vascular;  Laterality: Right;  . RADIAL HEAD ARTHROPLASTY Left 08/10/2017   Procedure: RADIAL HEAD ARTHROPLASTY;  Surgeon: Corky Mull, MD;  Location: [facility] ORS;  Service: Orthopedics;  Laterality: Left;  . TUBAL LIGATION  1977  . YAG LASER APPLICATION Bilateral 74/1287   Family History  Problem Relation Age of Onset  . Breast cancer Neg Hx    Allergies  Allergen Reactions  . Metformin Diarrhea      Assessment & Plan:  Patient presents for her first post to follow-up.  The patient is status post a right carotid endarterectomy on March 17, 2018.  The patient presents today with an unremarkable postoperative course.  Patient presents today without complaint.  The patient denies any pain.  Denies any issues with her incision. The patient denies experiencing Amaurosis Fugax, TIA like symptoms or focal motor deficits.  Patient denies any fever, nausea vomiting.  1. Diabetes mellitus without complication ([place]) - Stable Encouraged good control as its slows the progression of atherosclerotic disease  2. Hyperlipidemia, unspecified hyperlipidemia type - Stable Encouraged good control as its slows the progression of atherosclerotic disease  3. Stenosis of right carotid artery - s/p right carotid endarterectomy - New Patient presents without complaint. The patient's postoperative course has been unremarkable The patient's incision is healing well and she  is symptom-free The patient is to follow-up in 1 month and undergo a carotid duplex for her first postop ultrasound I have discussed with the patient at length the risk factors for and pathogenesis of atherosclerotic disease and encouraged a healthy diet, regular exercise regimen and blood pressure / glucose control.  Patient was instructed to contact our office in the interim with problems such as arm / leg weakness or numbness, speech / swallowing difficulty or temporary monocular blindness. The patient expresses their understanding.   - VAS US CAROTID; Future  Current Outpatient Medications on File Prior to Visit  Medication Sig Dispense Refill  . albuterol (PROVENTIL HFA) 108 (90 Base) MCG/ACT inhaler Inhale 2 puffs into the lungs  every 6 (six) hours as needed (rescue inhaler).     . ALPRAZolam (XANAX) 0.25 MG tablet Take 0.25 mg by mouth 2 (two) times daily as needed for anxiety.    Marland Kitchen aspirin EC 81 MG EC tablet Take 1 tablet (81 mg total) by mouth daily. 30 tablet 5  . atorvastatin (LIPITOR) 10 MG tablet Take 1 tablet (10 mg total) by mouth daily at 6 PM. 30 tablet 3  . b complex vitamins tablet Take 1 tablet by mouth daily.    . calcium-vitamin D (OSCAL WITH D) 500-200 MG-UNIT tablet Take 1 tablet by mouth daily with breakfast.    . cholecalciferol (VITAMIN D) 1000 units tablet Take 1,000 Units by mouth daily.     . Clobetasol Propionate 0.05 % shampoo Apply topically.    . clopidogrel (PLAVIX) 75 MG tablet Take 1 tablet (75 mg total) by mouth daily with breakfast. 30 tablet 5  . Fluticasone-Salmeterol (ADVAIR) 250-50 MCG/DOSE AEPB Inhale 1 puff into the lungs 2 (two) times daily.    Marland Kitchen glimepiride (AMARYL) 2 MG tablet Take by mouth.    . lidocaine-hydrocortisone (ANAMANTEL HC) 3-0.5 % CREA Apply topically. Place in outer ear as needed (for psoriasis)    . loratadine (CLARITIN) 10 MG tablet Take 10 mg by mouth daily.    . naproxen sodium (ALEVE) 220 MG tablet Take 220 mg by mouth 2 (two)  times daily as needed.    Marland Kitchen omega-3 acid ethyl esters (LOVAZA) 1 g capsule Take by mouth.    . Omega-3 Fatty Acids (FISH OIL) 1000 MG CAPS Take 1 capsule by mouth daily.    Marland Kitchen omeprazole (PRILOSEC) 20 MG capsule Take 20 mg by mouth daily.    . pramipexole (MIRAPEX) 0.25 MG tablet Take by mouth. Takes half a tablet in the morning and a whole tablet at bedtime.    . pyridoxine (B-6) 100 MG tablet Take 200 mg by mouth daily.     . sertraline (ZOLOFT) 100 MG tablet Take 50 mg by mouth daily. Takes half a tablet in the morning    . sitaGLIPtin (JANUVIA) 50 MG tablet Take 50 mg by mouth daily.    . Vit C-Cholecalciferol-Rose Hip (VITAMIN C & D3/ROSE HIPS) [phone] MG-UNIT-MG CAPS Take 1 tablet by mouth daily.    . vitamin B-12 (CYANOCOBALAMIN) 1000 MCG tablet Take 1,000 mcg by mouth daily.     . Vitamin D, Ergocalciferol, (DRISDOL) 50000 units CAPS capsule Take 50,000 Units by mouth every 7 (seven) days. On sunday    . vitamin E 400 UNIT capsule Take 400 Units by mouth daily.     No current facility-administered medications on file prior to visit.    There are no Patient Instructions on file for this visit. No follow-ups on file.  Yandel Zeiner A Sherilynn Dieu, PA-C

## 2018-05-09 ENCOUNTER — Encounter (INDEPENDENT_AMBULATORY_CARE_PROVIDER_SITE_OTHER): Payer: Self-pay | Admitting: Vascular Surgery

## 2018-05-09 ENCOUNTER — Ambulatory Visit (INDEPENDENT_AMBULATORY_CARE_PROVIDER_SITE_OTHER): Payer: BLUE CROSS/BLUE SHIELD | Admitting: Vascular Surgery

## 2018-05-09 ENCOUNTER — Ambulatory Visit (INDEPENDENT_AMBULATORY_CARE_PROVIDER_SITE_OTHER): Payer: BLUE CROSS/BLUE SHIELD

## 2018-05-09 VITALS — BP 156/71 | HR 89 | Resp 16 | Ht 64.0 in | Wt 200.0 lb

## 2018-05-09 DIAGNOSIS — I6521 Occlusion and stenosis of right carotid artery: Secondary | ICD-10-CM

## 2018-05-09 DIAGNOSIS — E785 Hyperlipidemia, unspecified: Secondary | ICD-10-CM

## 2018-05-09 DIAGNOSIS — E119 Type 2 diabetes mellitus without complications: Secondary | ICD-10-CM

## 2018-05-09 DIAGNOSIS — Z87891 Personal history of nicotine dependence: Secondary | ICD-10-CM

## 2018-05-09 NOTE — Progress Notes (Signed)
Subjective:    Patient ID: Tanya Ryan, female    DOB: 12-Jun-1951, 67 y.o.   MRN: [id number] Chief Complaint  Patient presents with  . Follow-up    71month carotid   Patient presents for her second postoperative follow-up.  The patient was seen approximately one month ago for her first postoperative follow-up.  The patient denies any amaurosis fugax or neurological symptoms.  Patient denies any issues with her incision.  The patient is experiencing some numbness around the incision site. The patient is disappointed that she does not have "more energy".  The patient felt that she would have "more energy" after the surgery however states she is actually gained weight.  The patient underwent a bilateral carotid artery duplex which was notable for velocities in the right ICA consistent with 4059% stenosis.  This was noted as intimal thickening without significant stenosis visualized.  Left carotid system were near normal with only minimal wall thickening.  Bilateral vertebral arteries demonstrate antegrade flow.  Normal flow hemodynamics were seen in the bilateral subclavian arteries.  Patient denies any fever, nausea or vomiting.  Review of Systems  Constitutional: Negative.   HENT: Negative.   Eyes: Negative.   Respiratory: Negative.   Cardiovascular:       Carotid Stenosis  Gastrointestinal: Negative.   Endocrine: Negative.   Genitourinary: Negative.   Musculoskeletal: Negative.   Skin: Negative.   Allergic/Immunologic: Negative.   Neurological: Negative.   Hematological: Negative.   Psychiatric/Behavioral: Negative.       Objective:   Physical Exam  Constitutional: She is oriented to person, place, and time. She appears well-developed and well-nourished. No distress.  HENT:  Head: Normocephalic and atraumatic.  Right Ear: External ear normal.  Left Ear: External ear normal.  Eyes: Pupils are equal, round, and reactive to light. Conjunctivae and EOM are normal.  Neck:  Normal range of motion.  No carotid bruits noted.   Cardiovascular: Normal rate, regular rhythm, normal heart sounds and intact distal pulses.  Pulses:      Radial pulses are 2+ on the right side, and 2+ on the left side.  Pulmonary/Chest: Effort normal and breath sounds normal.  Musculoskeletal: Normal range of motion. She exhibits no edema.  Neurological: She is alert and oriented to person, place, and time.  Skin: Skin is warm and dry. She is not diaphoretic.  Incisional completely healed.  Psychiatric: She has a normal mood and affect. Her behavior is normal. Judgment and thought content normal.  Vitals reviewed.  BP (!) 156/71 (BP Location: Left Arm)   Pulse 89   Resp 16   Ht 5\' 4"  (1.626 m)   Wt 200 lb (90.7 kg)   BMI 34.33 kg/m   Past Medical History:  Diagnosis Date  . Anemia   . Anxiety   . Asthma   . Cancer ([place])    skin. on face  . Complication of anesthesia 1990   pt woke up in middle of bunionectomy.   Marland Kitchen COPD (chronic obstructive pulmonary disease) ([place])   . Depression   . Diabetes mellitus without complication ([place])   . Dyspnea   . Dysrhythmia 01/2018   LBBB  . GERD (gastroesophageal reflux disease)   . History of broken nose 1970  . Restless leg syndrome    Social History   Socioeconomic History  . Marital status: Divorced    Spouse name: Not on file  . Number of children: 3  . Years of education: Not on file  .  Highest education level: 12th grade  Occupational History  . Occupation: Glass blower/designer  Social Needs  . Financial resource strain: Not on file  . Food insecurity:    Worry: Never true    Inability: Not on file  . Transportation needs:    Medical: No    Non-medical: Not on file  Tobacco Use  . Smoking status: Former Smoker    Packs/day: 1.50    Years: 45.00    Pack years: 67.50    Types: Cigarettes    Last attempt to quit: 08/04/2015    Years since quitting: 2.7  . Smokeless tobacco: Never Used  Substance and Sexual Activity  .  Alcohol use: Yes    Frequency: Never    Comment: socially  . Drug use: No  . Sexual activity: Not on file  Lifestyle  . Physical activity:    Days per week: 0 days    Minutes per session: Not on file  . Stress: Rather much  Relationships  . Social connections:    Talks on phone: Never    Gets together: Twice a week    Attends religious service: Never    Active member of club or organization: Yes    Attends meetings of clubs or organizations: 1 to 4 times per year    Relationship status: Divorced  . Intimate partner violence:    Fear of current or ex partner: No    Emotionally abused: No    Physically abused: No    Forced sexual activity: No  Other Topics Concern  . Not on file  Social History Narrative  . Not on file   Past Surgical History:  Procedure Laterality Date  . BUNIONECTOMY Bilateral 1990  . COLONOSCOPY  [id number]  . ENDARTERECTOMY Right 03/17/2018   Procedure: ENDARTERECTOMY CAROTID;  Surgeon: Algernon Huxley, MD;  Location: [facility] ORS;  Service: Vascular;  Laterality: Right;  . RADIAL HEAD ARTHROPLASTY Left 08/10/2017   Procedure: RADIAL HEAD ARTHROPLASTY;  Surgeon: Corky Mull, MD;  Location: [facility] ORS;  Service: Orthopedics;  Laterality: Left;  . TUBAL LIGATION  1977  . YAG LASER APPLICATION Bilateral 25/3664   Family History  Problem Relation Age of Onset  . Breast cancer Neg Hx    Allergies  Allergen Reactions  . Metformin Diarrhea      Assessment & Plan:  Patient presents for her second postoperative follow-up.  The patient was seen approximately one month ago for her first postoperative follow-up.  The patient denies any amaurosis fugax or neurological symptoms.  Patient denies any issues with her incision.  The patient is experiencing some numbness around the incision site. The patient is disappointed that she does not have "more energy".  The patient felt that she would have "more energy" after the surgery however states she is actually gained weight.   The patient underwent a bilateral carotid artery duplex which was notable for velocities in the right ICA consistent with 4059% stenosis.  This was noted as intimal thickening without significant stenosis visualized.  Left carotid system were near normal with only minimal wall thickening.  Bilateral vertebral arteries demonstrate antegrade flow.  Normal flow hemodynamics were seen in the bilateral subclavian arteries.  Patient denies any fever, nausea or vomiting.  1. Stenosis of right carotid artery - Stable Studies reviewed with patient. Patient asymptomatic with stable duplex.  No intervention at this time.  Patient to return in three months for surveillance carotid duplex. Patient to continue medical optimization with ASA, Plavix and  dyslipidemia medication. Patient to remain abstinent of tobacco use. I have discussed with the patient at length the risk factors for and pathogenesis of atherosclerotic disease and encouraged a healthy diet, regular exercise regimen and blood pressure / glucose control.  Patient was instructed to contact our office in the interim with problems such as arm / leg weakness or numbness, speech / swallowing difficulty or temporary monocular blindness. The patient expresses their understanding.   - VAS US CAROTID; Future  2. Diabetes mellitus without complication ([place]) - Stable Encouraged good control as its slows the progression of atherosclerotic disease  3. Hyperlipidemia, unspecified hyperlipidemia type - Stable On ASA and statin. Encouraged good control as its slows the progression of atherosclerotic disease  Current Outpatient Medications on File Prior to Visit  Medication Sig Dispense Refill  . albuterol (PROVENTIL HFA) 108 (90 Base) MCG/ACT inhaler Inhale 2 puffs into the lungs every 6 (six) hours as needed (rescue inhaler).     . ALPRAZolam (XANAX) 0.25 MG tablet Take 0.25 mg by mouth 2 (two) times daily as needed for anxiety.    Marland Kitchen aspirin EC 81 MG EC  tablet Take 1 tablet (81 mg total) by mouth daily. 30 tablet 5  . atorvastatin (LIPITOR) 10 MG tablet Take 1 tablet (10 mg total) by mouth daily at 6 PM. 30 tablet 3  . b complex vitamins tablet Take 1 tablet by mouth daily.    . calcium-vitamin D (OSCAL WITH D) 500-200 MG-UNIT tablet Take 1 tablet by mouth daily with breakfast.    . cholecalciferol (VITAMIN D) 1000 units tablet Take 1,000 Units by mouth daily.     . Clobetasol Propionate 0.05 % shampoo Apply topically.    . clopidogrel (PLAVIX) 75 MG tablet Take 1 tablet (75 mg total) by mouth daily with breakfast. 30 tablet 5  . Fluticasone-Salmeterol (ADVAIR) 250-50 MCG/DOSE AEPB Inhale 1 puff into the lungs 2 (two) times daily.    Marland Kitchen glimepiride (AMARYL) 2 MG tablet Take by mouth.    . lidocaine-hydrocortisone (ANAMANTEL HC) 3-0.5 % CREA Apply topically. Place in outer ear as needed (for psoriasis)    . loratadine (CLARITIN) 10 MG tablet Take 10 mg by mouth daily.    . naproxen sodium (ALEVE) 220 MG tablet Take 220 mg by mouth 2 (two) times daily as needed.    Marland Kitchen omega-3 acid ethyl esters (LOVAZA) 1 g capsule Take by mouth.    . Omega-3 Fatty Acids (FISH OIL) 1000 MG CAPS Take 1 capsule by mouth daily.    Marland Kitchen omeprazole (PRILOSEC) 20 MG capsule Take 20 mg by mouth daily.    . pramipexole (MIRAPEX) 0.25 MG tablet Take by mouth. Takes half a tablet in the morning and a whole tablet at bedtime.    . pyridoxine (B-6) 100 MG tablet Take 200 mg by mouth daily.     . sertraline (ZOLOFT) 100 MG tablet Take 50 mg by mouth daily. Takes half a tablet in the morning    . sitaGLIPtin (JANUVIA) 50 MG tablet Take 50 mg by mouth daily.    . Vit C-Cholecalciferol-Rose Hip (VITAMIN C & D3/ROSE HIPS) [phone] MG-UNIT-MG CAPS Take 1 tablet by mouth daily.    . vitamin B-12 (CYANOCOBALAMIN) 1000 MCG tablet Take 1,000 mcg by mouth daily.     . Vitamin D, Ergocalciferol, (DRISDOL) 50000 units CAPS capsule Take 50,000 Units by mouth every 7 (seven) days. On sunday     . vitamin E 400 UNIT capsule Take 400 Units by mouth  daily.     No current facility-administered medications on file prior to visit.    There are no Patient Instructions on file for this visit. No follow-ups on file.  Tanya Nier A Winner Valeriano, PA-C

## 2018-05-11 ENCOUNTER — Encounter (INDEPENDENT_AMBULATORY_CARE_PROVIDER_SITE_OTHER): Payer: BLUE CROSS/BLUE SHIELD

## 2018-05-11 ENCOUNTER — Ambulatory Visit (INDEPENDENT_AMBULATORY_CARE_PROVIDER_SITE_OTHER): Payer: BLUE CROSS/BLUE SHIELD | Admitting: Vascular Surgery

## 2018-05-16 DIAGNOSIS — F3341 Major depressive disorder, recurrent, in partial remission: Secondary | ICD-10-CM | POA: Insufficient documentation

## 2018-05-16 DIAGNOSIS — E1165 Type 2 diabetes mellitus with hyperglycemia: Secondary | ICD-10-CM | POA: Insufficient documentation

## 2018-08-10 ENCOUNTER — Ambulatory Visit (INDEPENDENT_AMBULATORY_CARE_PROVIDER_SITE_OTHER): Payer: BLUE CROSS/BLUE SHIELD

## 2018-08-10 ENCOUNTER — Encounter (INDEPENDENT_AMBULATORY_CARE_PROVIDER_SITE_OTHER): Payer: Self-pay | Admitting: Vascular Surgery

## 2018-08-10 ENCOUNTER — Ambulatory Visit (INDEPENDENT_AMBULATORY_CARE_PROVIDER_SITE_OTHER): Payer: BLUE CROSS/BLUE SHIELD | Admitting: Vascular Surgery

## 2018-08-10 VITALS — BP 132/66 | HR 84 | Resp 16 | Ht 63.0 in | Wt 199.8 lb

## 2018-08-10 DIAGNOSIS — E785 Hyperlipidemia, unspecified: Secondary | ICD-10-CM

## 2018-08-10 DIAGNOSIS — I6521 Occlusion and stenosis of right carotid artery: Secondary | ICD-10-CM | POA: Diagnosis not present

## 2018-08-10 DIAGNOSIS — E119 Type 2 diabetes mellitus without complications: Secondary | ICD-10-CM

## 2018-08-10 DIAGNOSIS — I6523 Occlusion and stenosis of bilateral carotid arteries: Secondary | ICD-10-CM

## 2018-08-10 NOTE — Progress Notes (Signed)
Subjective:    Patient ID: Tanya Ryan, female    DOB: 12-03-1950, 68 y.o.   MRN: [id number] Chief Complaint  Patient presents with  . Follow-up   Patient presents for a three month non-invasive study follow up for carotid stenosis.  The patient is status post a right carotid endarterectomy on March 17, 2018.  The stenosis has been followed by surveillance duplexes. The patient underwent a bilateral carotid duplex scan which showed no change from the previous exam on 05/09/18. Duplex is stable with a patent right endarterectomy (40-59%) and Left ICA stenosis (1-39%).  Vertebral arteries are antegrade bilaterally.  Bilateral subclavian arteries are multiphasic and within normal limits.  The patient denies experiencing Amaurosis Fugax, TIA like symptoms or focal motor deficits.  Denies fever, nausea vomiting.  Review of Systems  Constitutional: Negative.   HENT: Negative.   Eyes: Negative.   Respiratory: Negative.   Cardiovascular: Negative.   Gastrointestinal: Negative.   Endocrine: Negative.   Genitourinary: Negative.   Musculoskeletal: Negative.   Skin: Negative.   Allergic/Immunologic: Negative.   Neurological: Negative.   Hematological: Negative.   Psychiatric/Behavioral: Negative.       Objective:   Physical Exam Vitals signs reviewed.  Constitutional:      Appearance: Normal appearance.  HENT:     Head: Normocephalic and atraumatic.     Nose: Nose normal.     Mouth/Throat:     Mouth: Mucous membranes are moist.     Pharynx: Oropharynx is clear.  Eyes:     Extraocular Movements: Extraocular movements intact.     Conjunctiva/sclera: Conjunctivae normal.     Pupils: Pupils are equal, round, and reactive to light.  Neck:     Musculoskeletal: Normal range of motion.     Comments: Incision is completely healed.  No carotid bruits noted on exam.. Cardiovascular:     Rate and Rhythm: Normal rate and regular rhythm.     Pulses: Normal pulses.     Heart sounds:  Normal heart sounds.  Pulmonary:     Effort: Pulmonary effort is normal.     Breath sounds: Normal breath sounds.  Musculoskeletal: Normal range of motion.        General: No swelling.  Skin:    General: Skin is warm and dry.  Neurological:     General: No focal deficit present.     Mental Status: She is alert and oriented to person, place, and time. Mental status is at baseline.  Psychiatric:        Mood and Affect: Mood normal.        Behavior: Behavior normal.        Thought Content: Thought content normal.        Judgment: Judgment normal.    BP 132/66 (BP Location: Right Arm, Patient Position: Sitting)   Pulse 84   Resp 16   Ht 5\' 3"  (1.6 m)   Wt 199 lb 12.8 oz (90.6 kg)   BMI 35.39 kg/m   Past Medical History:  Diagnosis Date  . Anemia   . Anxiety   . Asthma   . Cancer ([place])    skin. on face  . Complication of anesthesia 1990   pt woke up in middle of bunionectomy.   Marland Kitchen COPD (chronic obstructive pulmonary disease) ([place])   . Depression   . Diabetes mellitus without complication ([place])   . Dyspnea   . Dysrhythmia 01/2018   LBBB  . GERD (gastroesophageal reflux disease)   . History  of broken nose 1970  . Restless leg syndrome    Social History   Socioeconomic History  . Marital status: Divorced    Spouse name: Not on file  . Number of children: 3  . Years of education: Not on file  . Highest education level: 12th grade  Occupational History  . Occupation: Glass blower/designer  Social Needs  . Financial resource strain: Not on file  . Food insecurity:    Worry: Never true    Inability: Not on file  . Transportation needs:    Medical: No    Non-medical: Not on file  Tobacco Use  . Smoking status: Former Smoker    Packs/day: 1.50    Years: 45.00    Pack years: 67.50    Types: Cigarettes    Last attempt to quit: 08/04/2015    Years since quitting: 3.0  . Smokeless tobacco: Never Used  Substance and Sexual Activity  . Alcohol use: Yes    Frequency: Never      Comment: socially  . Drug use: No  . Sexual activity: Not on file  Lifestyle  . Physical activity:    Days per week: 0 days    Minutes per session: Not on file  . Stress: Rather much  Relationships  . Social connections:    Talks on phone: Never    Gets together: Twice a week    Attends religious service: Never    Active member of club or organization: Yes    Attends meetings of clubs or organizations: 1 to 4 times per year    Relationship status: Divorced  . Intimate partner violence:    Fear of current or ex partner: No    Emotionally abused: No    Physically abused: No    Forced sexual activity: No  Other Topics Concern  . Not on file  Social History Narrative  . Not on file   Past Surgical History:  Procedure Laterality Date  . BUNIONECTOMY Bilateral 1990  . COLONOSCOPY  [id number]  . ENDARTERECTOMY Right 03/17/2018   Procedure: ENDARTERECTOMY CAROTID;  Surgeon: Algernon Huxley, MD;  Location: [facility] ORS;  Service: Vascular;  Laterality: Right;  . RADIAL HEAD ARTHROPLASTY Left 08/10/2017   Procedure: RADIAL HEAD ARTHROPLASTY;  Surgeon: Corky Mull, MD;  Location: [facility] ORS;  Service: Orthopedics;  Laterality: Left;  . TUBAL LIGATION  1977  . YAG LASER APPLICATION Bilateral 29/9371   Family History  Problem Relation Age of Onset  . Breast cancer Neg Hx    Allergies  Allergen Reactions  . Metformin Diarrhea      Assessment & Plan:  Patient presents for a three month non-invasive study follow up for carotid stenosis.  The patient is status post a right carotid endarterectomy on March 17, 2018.  The stenosis has been followed by surveillance duplexes. The patient underwent a bilateral carotid duplex scan which showed no change from the previous exam on 05/09/18. Duplex is stable with a patent right endarterectomy (40-59%) and Left ICA stenosis (1-39%).  Vertebral arteries are antegrade bilaterally.  Bilateral subclavian arteries are multiphasic and within normal limits.   The patient denies experiencing Amaurosis Fugax, TIA like symptoms or focal motor deficits.  Denies fever, nausea vomiting.  1. Bilateral carotid artery stenosis - Stable Studies reviewed with patient. Patient asymptomatic with stable duplex.  No intervention at this time.  Patient to return in six months for surveillance carotid duplex. Patient to continue medical optimization with ASA and dyslipidemia medication. Patient  to remain abstinent of tobacco use. I have discussed with the patient at length the risk factors for and pathogenesis of atherosclerotic disease and encouraged a healthy diet, regular exercise regimen and blood pressure / glucose control.  Patient was instructed to contact our office in the interim with problems such as arm / leg weakness or numbness, speech / swallowing difficulty or temporary monocular blindness. The patient expresses their understanding.   - VAS US CAROTID; Future  2. Diabetes mellitus without complication ([place]) - Stable On appropriate medications This is managed by the patient's primary care/endocrinologist Encouraged good control as its slows the progression of atherosclerotic disease  3. Hyperlipidemia, unspecified hyperlipidemia type - Stable This is managed by the patient's primary care Encouraged good control as its slows the progression of atherosclerotic disease  Current Outpatient Medications on File Prior to Visit  Medication Sig Dispense Refill  . albuterol (PROVENTIL HFA) 108 (90 Base) MCG/ACT inhaler Inhale 2 puffs into the lungs every 6 (six) hours as needed (rescue inhaler).     . ALPRAZolam (XANAX) 0.25 MG tablet Take 0.25 mg by mouth 2 (two) times daily as needed for anxiety.    Marland Kitchen aspirin EC 81 MG EC tablet Take 1 tablet (81 mg total) by mouth daily. 30 tablet 5  . atorvastatin (LIPITOR) 10 MG tablet Take 1 tablet (10 mg total) by mouth daily at 6 PM. 30 tablet 3  . b complex vitamins tablet Take 1 tablet by mouth daily.    .  Clobetasol Propionate 0.05 % shampoo Apply topically.    . clopidogrel (PLAVIX) 75 MG tablet Take 1 tablet (75 mg total) by mouth daily with breakfast. 30 tablet 5  . Fluticasone-Salmeterol (ADVAIR) 250-50 MCG/DOSE AEPB Inhale 1 puff into the lungs 2 (two) times daily.    Marland Kitchen glimepiride (AMARYL) 2 MG tablet Take by mouth.    . lidocaine-hydrocortisone (ANAMANTEL HC) 3-0.5 % CREA Apply topically. Place in outer ear as needed (for psoriasis)    . loratadine (CLARITIN) 10 MG tablet Take 10 mg by mouth daily.    . naproxen sodium (ALEVE) 220 MG tablet Take 220 mg by mouth 2 (two) times daily as needed.    . Omega-3 Fatty Acids (FISH OIL) 1000 MG CAPS Take 1 capsule by mouth daily.    Marland Kitchen omeprazole (PRILOSEC) 20 MG capsule Take 20 mg by mouth daily.    . pramipexole (MIRAPEX) 0.25 MG tablet Take by mouth. Takes half a tablet in the morning and a whole tablet at bedtime.    . pyridoxine (B-6) 100 MG tablet Take 200 mg by mouth daily.     . sertraline (ZOLOFT) 100 MG tablet Take 50 mg by mouth daily. Takes half a tablet in the morning    . sitaGLIPtin (JANUVIA) 50 MG tablet Take 50 mg by mouth daily.    . Vit C-Cholecalciferol-Rose Hip (VITAMIN C & D3/ROSE HIPS) [phone] MG-UNIT-MG CAPS Take 1 tablet by mouth daily.    . vitamin B-12 (CYANOCOBALAMIN) 1000 MCG tablet Take 1,000 mcg by mouth daily.     . Vitamin D, Ergocalciferol, (DRISDOL) 50000 units CAPS capsule Take 50,000 Units by mouth every 7 (seven) days. On sunday    . vitamin E 400 UNIT capsule Take 400 Units by mouth daily.     No current facility-administered medications on file prior to visit.    There are no Patient Instructions on file for this visit. No follow-ups on file.  Latorria Zeoli A Destry Bezdek, PA-C

## 2018-09-03 ENCOUNTER — Other Ambulatory Visit (INDEPENDENT_AMBULATORY_CARE_PROVIDER_SITE_OTHER): Payer: Self-pay | Admitting: Vascular Surgery

## 2018-09-12 ENCOUNTER — Other Ambulatory Visit (INDEPENDENT_AMBULATORY_CARE_PROVIDER_SITE_OTHER): Payer: Self-pay | Admitting: Vascular Surgery

## 2018-12-06 ENCOUNTER — Telehealth: Payer: Self-pay | Admitting: Neurology

## 2018-12-14 DIAGNOSIS — Z961 Presence of intraocular lens: Secondary | ICD-10-CM | POA: Insufficient documentation

## 2019-02-08 ENCOUNTER — Encounter (INDEPENDENT_AMBULATORY_CARE_PROVIDER_SITE_OTHER): Payer: Medicare Other

## 2019-02-08 ENCOUNTER — Ambulatory Visit (INDEPENDENT_AMBULATORY_CARE_PROVIDER_SITE_OTHER): Payer: Medicare Other | Admitting: Vascular Surgery

## 2019-02-16 ENCOUNTER — Other Ambulatory Visit: Payer: Self-pay

## 2019-02-16 ENCOUNTER — Encounter (INDEPENDENT_AMBULATORY_CARE_PROVIDER_SITE_OTHER): Payer: Self-pay

## 2019-02-16 ENCOUNTER — Encounter (INDEPENDENT_AMBULATORY_CARE_PROVIDER_SITE_OTHER): Payer: Self-pay | Admitting: Nurse Practitioner

## 2019-02-16 ENCOUNTER — Ambulatory Visit (INDEPENDENT_AMBULATORY_CARE_PROVIDER_SITE_OTHER): Payer: BC Managed Care – PPO | Admitting: Nurse Practitioner

## 2019-02-16 ENCOUNTER — Ambulatory Visit (INDEPENDENT_AMBULATORY_CARE_PROVIDER_SITE_OTHER): Payer: BC Managed Care – PPO

## 2019-02-16 VITALS — BP 121/64 | HR 83 | Resp 20 | Ht 63.0 in | Wt 192.0 lb

## 2019-02-16 DIAGNOSIS — E785 Hyperlipidemia, unspecified: Secondary | ICD-10-CM | POA: Diagnosis not present

## 2019-02-16 DIAGNOSIS — J449 Chronic obstructive pulmonary disease, unspecified: Secondary | ICD-10-CM | POA: Diagnosis not present

## 2019-02-16 DIAGNOSIS — E119 Type 2 diabetes mellitus without complications: Secondary | ICD-10-CM

## 2019-02-16 DIAGNOSIS — I6523 Occlusion and stenosis of bilateral carotid arteries: Secondary | ICD-10-CM | POA: Diagnosis not present

## 2019-02-16 DIAGNOSIS — I6521 Occlusion and stenosis of right carotid artery: Secondary | ICD-10-CM

## 2019-02-16 DIAGNOSIS — Z79899 Other long term (current) drug therapy: Secondary | ICD-10-CM

## 2019-02-16 DIAGNOSIS — Z7982 Long term (current) use of aspirin: Secondary | ICD-10-CM

## 2019-02-17 NOTE — Telephone Encounter (Signed)
error 

## 2019-02-19 ENCOUNTER — Encounter (INDEPENDENT_AMBULATORY_CARE_PROVIDER_SITE_OTHER): Payer: Self-pay | Admitting: Nurse Practitioner

## 2019-02-19 NOTE — Progress Notes (Signed)
SUBJECTIVE:  Patient ID: Tanya Ryan, female    DOB: 1951-01-06, 68 y.o.   MRN: [id number] Chief Complaint  Patient presents with  . Carotid    6 mth follow up    Tanya Ryan is a 68 y.o. female The patient is seen for follow up evaluation of carotid stenosis. The carotid stenosis followed by ultrasound. Patient underwent right carotid endarterectomy on 03/17/2018.  The patient denies amaurosis fugax. There is no recent history of TIA symptoms or focal motor deficits. There is no prior documented CVA.  The patient is taking enteric-coated aspirin 81 mg daily.  There is no history of migraine headaches. There is no history of seizures.  The patient has a history of coronary artery disease, no recent episodes of angina or shortness of breath. The patient denies PAD or claudication symptoms. There is a history of hyperlipidemia which is being treated with a statin.    Carotid Duplex done today shows 80-99% stenosis of the right internal carotid artery, 1-39% stenosis of the left. Significant increase in right ICA velocity compared to 08/10/2018.  Past Medical History:  Diagnosis Date  . Anemia   . Anxiety   . Asthma   . Cancer ([place])    skin. on face  . Complication of anesthesia 1990   pt woke up in middle of bunionectomy.   Marland Kitchen COPD (chronic obstructive pulmonary disease) ([place])   . Depression   . Diabetes mellitus without complication ([place])   . Dyspnea   . Dysrhythmia 01/2018   LBBB  . GERD (gastroesophageal reflux disease)   . History of broken nose 1970  . Restless leg syndrome     Past Surgical History:  Procedure Laterality Date  . BUNIONECTOMY Bilateral 1990  . COLONOSCOPY  [id number]  . ENDARTERECTOMY Right 03/17/2018   Procedure: ENDARTERECTOMY CAROTID;  Surgeon: Algernon Huxley, MD;  Location: [facility] ORS;  Service: Vascular;  Laterality: Right;  . RADIAL HEAD ARTHROPLASTY Left 08/10/2017   Procedure: RADIAL HEAD ARTHROPLASTY;  Surgeon: Corky Mull, MD;  Location: [facility] ORS;  Service: Orthopedics;  Laterality: Left;  . TUBAL LIGATION  1977  . YAG LASER APPLICATION Bilateral 07/8785    Social History   Socioeconomic History  . Marital status: Divorced    Spouse name: Not on file  . Number of children: 3  . Years of education: Not on file  . Highest education level: 12th grade  Occupational History  . Occupation: Glass blower/designer  Social Needs  . Financial resource strain: Not on file  . Food insecurity    Worry: Never true    Inability: Not on file  . Transportation needs    Medical: No    Non-medical: Not on file  Tobacco Use  . Smoking status: Former Smoker    Packs/day: 1.50    Years: 45.00    Pack years: 67.50    Types: Cigarettes    Quit date: 08/04/2015    Years since quitting: 3.5  . Smokeless tobacco: Never Used  Substance and Sexual Activity  . Alcohol use: Yes    Frequency: Never    Comment: socially  . Drug use: No  . Sexual activity: Not on file  Lifestyle  . Physical activity    Days per week: 0 days    Minutes per session: Not on file  . Stress: Rather much  Relationships  . Social Herbalist on phone: Never    Gets together: Twice a  week    Attends religious service: Never    Active member of club or organization: Yes    Attends meetings of clubs or organizations: 1 to 4 times per year    Relationship status: Divorced  . Intimate partner violence    Fear of current or ex partner: No    Emotionally abused: No    Physically abused: No    Forced sexual activity: No  Other Topics Concern  . Not on file  Social History Narrative  . Not on file    Family History  Problem Relation Age of Onset  . Breast cancer Neg Hx     Allergies  Allergen Reactions  . Metformin Diarrhea     Review of Systems   Review of Systems: Negative Unless Checked Constitutional: [] Weight loss  [] Fever  [] Chills Cardiac: [] Chest pain   []  Atrial Fibrillation  [] Palpitations   [] Shortness of breath  when laying flat   [] Shortness of breath with exertion. [] Shortness of breath at rest Vascular:  [] Pain in legs with walking   [] Pain in legs with standing [] Pain in legs when laying flat   [] Claudication    [] Pain in feet when laying flat    [] History of DVT   [] Phlebitis   [] Swelling in legs   [] Varicose veins   [] Non-healing ulcers Pulmonary:   [] Uses home oxygen   [] Productive cough   [] Hemoptysis   [] Wheeze  [x] COPD   [] Asthma Neurologic:  [] Dizziness   [] Seizures  [] Blackouts [] History of stroke   [] History of TIA  [] Aphasia   [] Temporary Blindness   [] Weakness or numbness in arm   [] Weakness or numbness in leg Musculoskeletal:   [] Joint swelling   [] Joint pain   [] Low back pain  []  History of Knee Replacement [] Arthritis [] back Surgeries  []  Spinal Stenosis    Hematologic:  [] Easy bruising  [] Easy bleeding   [] Hypercoagulable state   [x] Anemic Gastrointestinal:  [] Diarrhea   [] Vomiting  [x] Gastroesophageal reflux/heartburn   [] Difficulty swallowing. [] Abdominal pain Genitourinary:  [] Chronic kidney disease   [] Difficult urination  [] Anuric   [] Blood in urine [] Frequent urination  [] Burning with urination   [] Hematuria Skin:  [] Rashes   [] Ulcers [] Wounds Psychological:  [x] History of anxiety   [x]  History of major depression  []  Memory Difficulties      OBJECTIVE:   Physical Exam  BP 121/64 (BP Location: Right Arm)   Pulse 83   Resp 20   Ht 5\' 3"  (1.6 m)   Wt 192 lb (87.1 kg)   BMI 34.01 kg/m   Gen: WD/WN, NAD Head: [place]/AT, No temporalis wasting.  Ear/Nose/Throat: Hearing grossly intact, nares w/o erythema or drainage Eyes: PER, EOMI, sclera nonicteric.  Neck: Supple, no masses.  No JVD. No bruit auscultated  Pulmonary:  Good air movement, no use of accessory muscles.  Cardiac: RRR Vascular:  Vessel Right Left  Radial Palpable Palpable   Gastrointestinal: soft, non-distended. No guarding/no peritoneal signs.  Musculoskeletal: M/S 5/5 throughout.  No deformity or atrophy.   Neurologic: Pain and light touch intact in extremities.  Symmetrical.  Speech is fluent. Motor exam as listed above. Psychiatric: Judgment intact, Mood & affect appropriate for pt's clinical situation. Dermatologic: No Venous rashes. No Ulcers Noted.  No changes consistent with cellulitis. Lymph : No Cervical lymphadenopathy, no lichenification or skin changes of chronic lymphedema.       ASSESSMENT AND PLAN:  1. Stenosis of right carotid artery Carotid Duplex done today shows 80-99% stenosis of the right internal carotid artery,  1-39% stenosis of the left. Significant increase in right ICA velocity compared to 08/10/2018.  The patient remains asymptomatic with respect to the carotid stenosis.  However, the patient has now progressed and has a lesion the is >70%.  Patient should undergo CT angiography of the carotid arteries to define the degree of stenosis of the internal carotid arteries bilaterally and the anatomic suitability for surgery vs. intervention.  If the patient does indeed need surgery cardiac clearance will be required, once cleared the patient will be scheduled for surgery.  The risks, benefits and alternative therapies were reviewed in detail with the patient.  All questions were answered.  The patient agrees to proceed with imaging.  Continue antiplatelet therapy as prescribed. Continue management of CAD, HTN and Hyperlipidemia. Healthy heart diet, encouraged exercise at least 4 times per week.   - CT ANGIO NECK W OR WO CONTRAST; Future - CT ANGIO HEAD W OR WO CONTRAST; Future  2. Hyperlipidemia, unspecified hyperlipidemia type Continue statin as ordered and reviewed, no changes at this time   3. Chronic obstructive pulmonary disease, unspecified COPD type ([place]) Continue pulmonary medications and aerosols as already ordered, these medications have been reviewed and there are no changes at this time.    4. Diabetes mellitus without complication ([place]) Continue  hypoglycemic medications as already ordered, these medications have been reviewed and there are no changes at this time.  Hgb A1C to be monitored as already arranged by primary service   Current Outpatient Medications on File Prior to Visit  Medication Sig Dispense Refill  . albuterol (PROVENTIL HFA) 108 (90 Base) MCG/ACT inhaler Inhale 2 puffs into the lungs every 6 (six) hours as needed (rescue inhaler).     . ALPRAZolam (XANAX) 0.25 MG tablet Take 0.25 mg by mouth 2 (two) times daily as needed for anxiety.    . ASPIRIN LOW DOSE 81 MG EC tablet TAKE 1 TABLET BY MOUTH EVERY DAY 30 tablet 5  . atorvastatin (LIPITOR) 10 MG tablet Take 1 tablet (10 mg total) by mouth daily at 6 PM. 30 tablet 3  . b complex vitamins tablet Take 1 tablet by mouth daily.    . benzonatate (TESSALON) 200 MG capsule TAKE 1 CAPSULE BY MOUTH EVERY DAY 3 TIMES A DAY AS NEEDED FOR COUGH    . calcium-vitamin D (OSCAL 500/200 D-3) 500-200 MG-UNIT tablet Take 1 tablet by mouth.    . Clobetasol Propionate 0.05 % shampoo Apply topically.    . clopidogrel (PLAVIX) 75 MG tablet TAKE 1 TABLET BY MOUTH IN THE MORNING WITH BREAKFAST 30 tablet 5  . glimepiride (AMARYL) 2 MG tablet Take 4 mg by mouth.     Marland Kitchen glucose blood test strip Use once daily Use as instructed.    . lidocaine-hydrocortisone (ANAMANTEL HC) 3-0.5 % CREA Apply topically. Place in outer ear as needed (for psoriasis)    . loratadine (CLARITIN) 10 MG tablet Take 10 mg by mouth daily.    . Multiple Vitamin (MULTI-VITAMIN DAILY PO) Take by mouth.    . naproxen sodium (ALEVE) 220 MG tablet Take 220 mg by mouth 2 (two) times daily as needed.    . Omega-3 Fatty Acids (FISH OIL) 1000 MG CAPS Take 1 capsule by mouth daily.    Marland Kitchen omeprazole (PRILOSEC) 20 MG capsule Take 20 mg by mouth daily.    . pramipexole (MIRAPEX) 0.25 MG tablet Take 0.25 mg by mouth. Takes half a tablet in the morning and a whole tablet at bedtime.    Marland Kitchen  pyridoxine (B-6) 100 MG tablet Take 200 mg by mouth  daily.     . sertraline (ZOLOFT) 100 MG tablet Take 50 mg by mouth daily. Takes half a tablet in the morning    . sitaGLIPtin (JANUVIA) 50 MG tablet Take 50 mg by mouth daily.    . Vit C-Cholecalciferol-Rose Hip (VITAMIN C & D3/ROSE HIPS) [phone] MG-UNIT-MG CAPS Take 1 tablet by mouth daily.    . vitamin B-12 (CYANOCOBALAMIN) 1000 MCG tablet Take 1,000 mcg by mouth daily.     . Vitamin D, Ergocalciferol, (DRISDOL) 50000 units CAPS capsule Take 50,000 Units by mouth every 7 (seven) days. On sunday    . vitamin E 400 UNIT capsule Take 400 Units by mouth daily.    . Fluticasone-Salmeterol (ADVAIR) 250-50 MCG/DOSE AEPB Inhale 1 puff into the lungs 2 (two) times daily.     No current facility-administered medications on file prior to visit.     There are no Patient Instructions on file for this visit. No follow-ups on file.   Kris Hartmann, NP  This note was completed with Sales executive.  Any errors are purely unintentional.

## 2019-02-20 ENCOUNTER — Other Ambulatory Visit: Payer: Self-pay | Admitting: Internal Medicine

## 2019-02-20 DIAGNOSIS — Z1231 Encounter for screening mammogram for malignant neoplasm of breast: Secondary | ICD-10-CM

## 2019-02-28 ENCOUNTER — Other Ambulatory Visit: Payer: Self-pay

## 2019-02-28 ENCOUNTER — Ambulatory Visit
Admission: RE | Admit: 2019-02-28 | Discharge: 2019-02-28 | Disposition: A | Discharge status: Home / Self Care | Payer: BC Managed Care – PPO | Source: Ambulatory Visit | Attending: Nurse Practitioner | Admitting: Nurse Practitioner

## 2019-02-28 DIAGNOSIS — I6521 Occlusion and stenosis of right carotid artery: Secondary | ICD-10-CM

## 2019-02-28 HISTORY — DX: Essential (primary) hypertension: I10

## 2019-02-28 MED ORDER — IOHEXOL 350 MG/ML SOLN
100.0000 mL | Freq: Once | INTRAVENOUS | Status: AC | PRN
  Administered 2019-02-28: 100 mL via INTRAVENOUS

## 2019-03-14 ENCOUNTER — Other Ambulatory Visit: Payer: Self-pay

## 2019-03-14 ENCOUNTER — Encounter (INDEPENDENT_AMBULATORY_CARE_PROVIDER_SITE_OTHER): Payer: Self-pay | Admitting: Vascular Surgery

## 2019-03-14 ENCOUNTER — Ambulatory Visit (INDEPENDENT_AMBULATORY_CARE_PROVIDER_SITE_OTHER): Payer: BC Managed Care – PPO | Admitting: Vascular Surgery

## 2019-03-14 VITALS — BP 123/68 | HR 84 | Resp 16 | Wt 193.0 lb

## 2019-03-14 DIAGNOSIS — E785 Hyperlipidemia, unspecified: Secondary | ICD-10-CM

## 2019-03-14 DIAGNOSIS — E119 Type 2 diabetes mellitus without complications: Secondary | ICD-10-CM | POA: Diagnosis not present

## 2019-03-14 DIAGNOSIS — I6521 Occlusion and stenosis of right carotid artery: Secondary | ICD-10-CM | POA: Diagnosis not present

## 2019-03-14 DIAGNOSIS — I6523 Occlusion and stenosis of bilateral carotid arteries: Secondary | ICD-10-CM

## 2019-03-14 NOTE — Assessment & Plan Note (Signed)
I have independently reviewed her CT angiogram and she does in fact have greater than 80% recurrent stenosis of the right carotid system.  This appears to be just proximal to her endarterectomy site and is worse which may be from a clamp site hyperplastic lesion.  This has quickly progressed over a years time and is now high-grade.  At this point, it is clear that her best course of action would be carotid stent placement for high-grade recurrent stenosis I discussed the differences between carotid endarterectomy and carotid stent placement.  She will continue her aspirin, Plavix, and statin agent.

## 2019-03-14 NOTE — Progress Notes (Signed)
MRN : [id number]  Tanya Ryan is a 68 y.o. (1951/04/08) female who presents with chief complaint of  Chief Complaint  Patient presents with   Follow-up    ct results  .  History of Present Illness: Patient returns today in follow up of her carotid disease.  She is 1 year status post right carotid endarterectomy but her follow-up duplex studies had shown initially some mild recurrent stenosis on the right and most recently a high-grade recurrent stenosis on the right.  She feels well.  She does not have specific complaints today.  After her duplex this demonstrated high-grade stenosis, she went for a CT angiogram. I have independently reviewed her CT angiogram and she does in fact have greater than 80% recurrent stenosis of the right carotid system.  This appears to be just proximal to her endarterectomy site and is worse which may be from a clamp site hyperplastic lesion.  Current Outpatient Medications  Medication Sig Dispense Refill   albuterol (PROVENTIL HFA) 108 (90 Base) MCG/ACT inhaler Inhale 2 puffs into the lungs every 6 (six) hours as needed (rescue inhaler).      ALPRAZolam (XANAX) 0.25 MG tablet Take 0.25 mg by mouth 2 (two) times daily as needed for anxiety.     ASPIRIN LOW DOSE 81 MG EC tablet TAKE 1 TABLET BY MOUTH EVERY DAY 30 tablet 5   atorvastatin (LIPITOR) 10 MG tablet Take 1 tablet (10 mg total) by mouth daily at 6 PM. 30 tablet 3   b complex vitamins tablet Take 1 tablet by mouth daily.     benzonatate (TESSALON) 200 MG capsule TAKE 1 CAPSULE BY MOUTH EVERY DAY 3 TIMES A DAY AS NEEDED FOR COUGH     calcium-vitamin D (OSCAL 500/200 D-3) 500-200 MG-UNIT tablet Take 1 tablet by mouth.     Clobetasol Propionate 0.05 % shampoo Apply topically.     clopidogrel (PLAVIX) 75 MG tablet TAKE 1 TABLET BY MOUTH IN THE MORNING WITH BREAKFAST 30 tablet 5   Fluticasone-Salmeterol (ADVAIR) 250-50 MCG/DOSE AEPB Inhale 1 puff into the lungs 2 (two) times daily.      glucose blood test strip Use once daily Use as instructed.     lidocaine-hydrocortisone (ANAMANTEL HC) 3-0.5 % CREA Apply topically. Place in outer ear as needed (for psoriasis)     loratadine (CLARITIN) 10 MG tablet Take 10 mg by mouth daily.     Multiple Vitamin (MULTI-VITAMIN DAILY PO) Take by mouth.     naproxen sodium (ALEVE) 220 MG tablet Take 220 mg by mouth 2 (two) times daily as needed.     Omega-3 Fatty Acids (FISH OIL) 1000 MG CAPS Take 1 capsule by mouth daily.     omeprazole (PRILOSEC) 20 MG capsule Take 20 mg by mouth daily.     pioglitazone (ACTOS) 30 MG tablet Take 30 mg by mouth daily.     pyridoxine (B-6) 100 MG tablet Take 200 mg by mouth daily.      sertraline (ZOLOFT) 100 MG tablet Take 50 mg by mouth daily. Takes half a tablet in the morning     sitaGLIPtin (JANUVIA) 50 MG tablet Take 50 mg by mouth daily.     Vit C-Cholecalciferol-Rose Hip (VITAMIN C & D3/ROSE HIPS) [phone] MG-UNIT-MG CAPS Take 1 tablet by mouth daily.     vitamin B-12 (CYANOCOBALAMIN) 1000 MCG tablet Take 1,000 mcg by mouth daily.      Vitamin D, Ergocalciferol, (DRISDOL) 50000 units CAPS capsule Take 50,000 Units by  mouth every 7 (seven) days. On sunday     vitamin E 400 UNIT capsule Take 400 Units by mouth daily.     glimepiride (AMARYL) 2 MG tablet Take 4 mg by mouth.      pramipexole (MIRAPEX) 0.25 MG tablet Take 0.25 mg by mouth. Takes half a tablet in the morning and a whole tablet at bedtime.     No current facility-administered medications for this visit.     Past Medical History:  Diagnosis Date   Anemia    Anxiety    Asthma    Cancer ([place])    skin. on face   Complication of anesthesia 1990   pt woke up in middle of bunionectomy.    COPD (chronic obstructive pulmonary disease) ([place])    Depression    Diabetes mellitus without complication ([place])    Dyspnea    Dysrhythmia 01/2018   LBBB   GERD (gastroesophageal reflux disease)    History of broken nose  1970   Hypertension    Restless leg syndrome     Past Surgical History:  Procedure Laterality Date   BUNIONECTOMY Bilateral 1990   COLONOSCOPY  4315,[id number]   ENDARTERECTOMY Right 03/17/2018   Procedure: ENDARTERECTOMY CAROTID;  Surgeon: Algernon Huxley, MD;  Location: [facility];  Service: Vascular;  Laterality: Right;   RADIAL HEAD ARTHROPLASTY Left 08/10/2017   Procedure: RADIAL HEAD ARTHROPLASTY;  Surgeon: Corky Mull, MD;  Location: [facility];  Service: Orthopedics;  Laterality: Left;   TUBAL LIGATION  6761   YAG LASER APPLICATION Bilateral 95/0932    Social History Social History   Tobacco Use   Smoking status: Former Smoker    Packs/day: 1.50    Years: 45.00    Pack years: 67.50    Types: Cigarettes    Quit date: 08/04/2015    Years since quitting: 3.6   Smokeless tobacco: Never Used  Substance Use Topics   Alcohol use: Yes    Frequency: Never    Comment: socially   Drug use: No     Family History Family History  Problem Relation Age of Onset   Breast cancer Neg Hx   no bleeding disorders, clotting disorders, no aneurysms, no autoimmune diseases  Allergies  Allergen Reactions   Metformin Diarrhea     REVIEW OF SYSTEMS (Negative unless checked)  Constitutional: [] Weight loss  [] Fever  [] Chills Cardiac: [] Chest pain   [] Chest pressure   [] Palpitations   [] Shortness of breath when laying flat   [] Shortness of breath at rest   [] Shortness of breath with exertion. Vascular:  [] Pain in legs with walking   [] Pain in legs at rest   [] Pain in legs when laying flat   [] Claudication   [] Pain in feet when walking  [] Pain in feet at rest  [] Pain in feet when laying flat   [] History of DVT   [] Phlebitis   [] Swelling in legs   [] Varicose veins   [] Non-healing ulcers Pulmonary:   [] Uses home oxygen   [] Productive cough   [] Hemoptysis   [] Wheeze  [x] COPD   [x] Asthma Neurologic:  [] Dizziness  [] Blackouts   [] Seizures   [] History of stroke   [] History of TIA  [] Aphasia    [] Temporary blindness   [] Dysphagia   [] Weakness or numbness in arms   [] Weakness or numbness in legs Musculoskeletal:  [x] Arthritis   [] Joint swelling   [] Joint pain   [] Low back pain Hematologic:  [] Easy bruising  [] Easy bleeding   [] Hypercoagulable state   [x] Anemic  Gastrointestinal:  [] Blood in stool   [] Vomiting blood  [] Gastroesophageal reflux/heartburn   [] Abdominal pain Genitourinary:  [] Chronic kidney disease   [] Difficult urination  [] Frequent urination  [] Burning with urination   [] Hematuria Skin:  [] Rashes   [] Ulcers   [] Wounds Psychological:  [x] History of anxiety   []  History of major depression.  Physical Examination  BP 123/68 (BP Location: Right Arm)    Pulse 84    Resp 16    Wt 193 lb (87.5 kg)    BMI 34.19 kg/m  Gen:  WD/WN, NAD Head: [place]/AT, No temporalis wasting. Ear/Nose/Throat: Hearing grossly intact, nares w/o erythema or drainage Eyes: Conjunctiva clear. Sclera non-icteric Neck: Supple.  Trachea midline Pulmonary:  Good air movement, no use of accessory muscles.  Cardiac: RRR, no JVD Vascular: Vessel Right Left  Radial Palpable Palpable                          PT Palpable Palpable  DP Palpable Palpable   Gastrointestinal: soft, non-tender/non-distended. No guarding/reflex.  Musculoskeletal: M/S 5/5 throughout.  No deformity or atrophy. No edema. Neurologic: Sensation grossly intact in extremities.  Symmetrical.  Speech is fluent.  Psychiatric: Judgment intact, Mood & affect appropriate for pt's clinical situation. Dermatologic: No rashes or ulcers noted.  No cellulitis or open wounds.       Labs No results found for this or any previous visit (from the past 2160 hour(s)).  Radiology Ct Angio Head W Or Wo Contrast  Result Date: 02/28/2019 CLINICAL DATA:  Follow-up carotid stenosis. Right carotid endarterectomy August 2019. 80-99% stenosis shown on the right by ultrasound. EXAM: CT ANGIOGRAPHY HEAD AND NECK TECHNIQUE: Multidetector CT imaging  of the head and neck was performed using the standard protocol during bolus administration of intravenous contrast. Multiplanar CT image reconstructions and MIPs were obtained to evaluate the vascular anatomy. Carotid stenosis measurements (when applicable) are obtained utilizing NASCET criteria, using the distal internal carotid diameter as the denominator. CONTRAST:  133mL OMNIPAQUE IOHEXOL 350 MG/ML SOLN COMPARISON:  CT 01/07/2018 FINDINGS: CT HEAD FINDINGS Brain: The brain shows a normal appearance without evidence of malformation, atrophy, old or acute small or large vessel infarction, mass lesion, hemorrhage, hydrocephalus or extra-axial collection. Vascular: No hyperdense vessel. No evidence of atherosclerotic calcification. Skull: Normal.  No traumatic finding.  No focal bone lesion. Sinuses/Orbits: Sinuses are clear. Orbits appear normal. Mastoids are clear. Other: None significant CTA NECK FINDINGS Aortic arch: Aortic atherosclerosis. No aneurysm or dissection. Branching pattern is normal without origin stenosis. Right carotid system: Common carotid artery shows soft plaque in the distal 2 cm with severe stenosis just proximal to the bifurcation, lumen measuring 1 mm. Proximal external carotid artery show stenosis of about 30%. Proximal ICA shows a diameter of 1.5 mm. At the distal bulb, there is a second stenosis with luminal diameter of 1 mm. These findings are consistent with serial flow limiting stenoses. More distal cervical ICA shows a small diameter because of flow reduction. These stenoses are difficult to measure in the setting flow reduction of the distal vessels, but they are consistent with stenoses 80% or greater. Left carotid system: Common carotid artery shows some plaque but no stenosis. At the bifurcation and ICA bulb, there is calcified plaque. Minimal diameter the proximal ICA is 2 mm. Compared to a more distal cervical ICA diameter of 4 mm, this indicates a 50% stenosis. Vertebral  arteries: There is calcified plaque at the right vertebral artery origin but no  stenosis. Left vertebral artery origin is widely patent. The proximal vessel is tortuous but widely patent. Both vertebral arteries are widely patent through the cervical region without stenosis. Skeleton: Mild cervical spondylosis. Other neck: Benign appearing calcified thyroid nodule on the left. No regional adenopathy. Upper chest: Normal Review of the MIP images confirms the above findings CTA HEAD FINDINGS Anterior circulation: Both internal carotid arteries are patent through the skull base and siphon regions. There is siphon atherosclerotic calcification but no stenosis greater than 30%. The anterior and middle cerebral vessels are patent without proximal stenosis, aneurysm or vascular malformation. Posterior circulation: Both vertebral arteries are patent through the foramen magnum to the basilar. No basilar stenosis. Posterior circulation branch vessels appear patent and normal. Venous sinuses: Patent and normal. Anatomic variants: None significant. Review of the MIP images confirms the above findings IMPRESSION: High-grade serial stenoses of the right carotid system. The distal common carotid artery just proximal to the bifurcation is narrowed due to soft plaque with a diameter of 1 mm. Stenosis at the ICA origin measures 1.5 mm. Stenosis at the distal bulb measures 1 mm. All of the stenoses are consistent with 80% or greater lesions. Reduction of the diameter of the more distal cervical ICA because of flow reduction. Atherosclerotic plaque at the ICA bulb on the left with stenosis of 50% at the origin. No significant posterior circulation finding. No intracranial stenosis or occlusion. Electronically Signed   By: Nelson Chimes M.D.   On: 02/28/2019 18:42   Ct Angio Neck W Or Wo Contrast  Result Date: 02/28/2019 CLINICAL DATA:  Follow-up carotid stenosis. Right carotid endarterectomy August 2019. 80-99% stenosis shown on the  right by ultrasound. EXAM: CT ANGIOGRAPHY HEAD AND NECK TECHNIQUE: Multidetector CT imaging of the head and neck was performed using the standard protocol during bolus administration of intravenous contrast. Multiplanar CT image reconstructions and MIPs were obtained to evaluate the vascular anatomy. Carotid stenosis measurements (when applicable) are obtained utilizing NASCET criteria, using the distal internal carotid diameter as the denominator. CONTRAST:  112mL OMNIPAQUE IOHEXOL 350 MG/ML SOLN COMPARISON:  CT 01/07/2018 FINDINGS: CT HEAD FINDINGS Brain: The brain shows a normal appearance without evidence of malformation, atrophy, old or acute small or large vessel infarction, mass lesion, hemorrhage, hydrocephalus or extra-axial collection. Vascular: No hyperdense vessel. No evidence of atherosclerotic calcification. Skull: Normal.  No traumatic finding.  No focal bone lesion. Sinuses/Orbits: Sinuses are clear. Orbits appear normal. Mastoids are clear. Other: None significant CTA NECK FINDINGS Aortic arch: Aortic atherosclerosis. No aneurysm or dissection. Branching pattern is normal without origin stenosis. Right carotid system: Common carotid artery shows soft plaque in the distal 2 cm with severe stenosis just proximal to the bifurcation, lumen measuring 1 mm. Proximal external carotid artery show stenosis of about 30%. Proximal ICA shows a diameter of 1.5 mm. At the distal bulb, there is a second stenosis with luminal diameter of 1 mm. These findings are consistent with serial flow limiting stenoses. More distal cervical ICA shows a small diameter because of flow reduction. These stenoses are difficult to measure in the setting flow reduction of the distal vessels, but they are consistent with stenoses 80% or greater. Left carotid system: Common carotid artery shows some plaque but no stenosis. At the bifurcation and ICA bulb, there is calcified plaque. Minimal diameter the proximal ICA is 2 mm. Compared to  a more distal cervical ICA diameter of 4 mm, this indicates a 50% stenosis. Vertebral arteries: There is calcified plaque at the  right vertebral artery origin but no stenosis. Left vertebral artery origin is widely patent. The proximal vessel is tortuous but widely patent. Both vertebral arteries are widely patent through the cervical region without stenosis. Skeleton: Mild cervical spondylosis. Other neck: Benign appearing calcified thyroid nodule on the left. No regional adenopathy. Upper chest: Normal Review of the MIP images confirms the above findings CTA HEAD FINDINGS Anterior circulation: Both internal carotid arteries are patent through the skull base and siphon regions. There is siphon atherosclerotic calcification but no stenosis greater than 30%. The anterior and middle cerebral vessels are patent without proximal stenosis, aneurysm or vascular malformation. Posterior circulation: Both vertebral arteries are patent through the foramen magnum to the basilar. No basilar stenosis. Posterior circulation branch vessels appear patent and normal. Venous sinuses: Patent and normal. Anatomic variants: None significant. Review of the MIP images confirms the above findings IMPRESSION: High-grade serial stenoses of the right carotid system. The distal common carotid artery just proximal to the bifurcation is narrowed due to soft plaque with a diameter of 1 mm. Stenosis at the ICA origin measures 1.5 mm. Stenosis at the distal bulb measures 1 mm. All of the stenoses are consistent with 80% or greater lesions. Reduction of the diameter of the more distal cervical ICA because of flow reduction. Atherosclerotic plaque at the ICA bulb on the left with stenosis of 50% at the origin. No significant posterior circulation finding. No intracranial stenosis or occlusion. Electronically Signed   By: Nelson Chimes M.D.   On: 02/28/2019 18:42   Vas US Carotid  Result Date: 02/16/2019 Carotid Arterial Duplex Study Indications:        Carotid artery disease. Other Factors:     Right carotid endarterectomy on 03/17/18. Comparison Study:  08/10/2018 Performing Technologist: Almira Coaster RVS  Examination Guidelines: A complete evaluation includes B-mode imaging, spectral Doppler, color Doppler, and power Doppler as needed of all accessible portions of each vessel. Bilateral testing is considered an integral part of a complete examination. Limited examinations for reoccurring indications may be performed as noted.  Right Carotid Findings: +----------+--------+--------+--------+--------+--------+             PSV cm/s EDV cm/s Stenosis Describe Comments  +----------+--------+--------+--------+--------+--------+  CCA Prox   65       20                                   +----------+--------+--------+--------+--------+--------+  CCA Mid    62       20                                   +----------+--------+--------+--------+--------+--------+  CCA Distal 81       24                                   +----------+--------+--------+--------+--------+--------+  ICA Prox   283      124                                  +----------+--------+--------+--------+--------+--------+  ICA Mid    78       17                                   +----------+--------+--------+--------+--------+--------+  ICA Distal 62       11                                   +----------+--------+--------+--------+--------+--------+  ECA        279      23                                   +----------+--------+--------+--------+--------+--------+ +----------+--------+-------+--------+-------------------+             PSV cm/s EDV cms Describe Arm Pressure (mmHG)  +----------+--------+-------+--------+-------------------+  Subclavian 140      0                                     +----------+--------+-------+--------+-------------------+ +---------+--------+--+--------+--+  Vertebral PSV cm/s 56 EDV cm/s 12  +---------+--------+--+--------+--+  Left Carotid Findings:  +----------+-------+-------+--------+---------------------------------+--------+             PSV     EDV     Stenosis Describe                          Comments              cm/s    cm/s                                                         +----------+-------+-------+--------+---------------------------------+--------+  CCA Prox   126     26                                                           +----------+-------+-------+--------+---------------------------------+--------+  CCA Mid    123     33                                                           +----------+-------+-------+--------+---------------------------------+--------+  CCA Distal 126     34               heterogenous, irregular and                                                      calcific                                    +----------+-------+-------+--------+---------------------------------+--------+  ICA Prox   71      23                                                           +----------+-------+-------+--------+---------------------------------+--------+  ICA Mid    92      29                                                           +----------+-------+-------+--------+---------------------------------+--------+  ICA Distal 105     37                                                           +----------+-------+-------+--------+---------------------------------+--------+  ECA        125     15                                                           +----------+-------+-------+--------+---------------------------------+--------+ +----------+--------+--------+--------+-------------------+  Subclavian PSV cm/s EDV cm/s Describe Arm Pressure (mmHG)  +----------+--------+--------+--------+-------------------+             154      0                                      +----------+--------+--------+--------+-------------------+ +---------+--------+--+--------+--+  Vertebral PSV cm/s 60 EDV cm/s 13  +---------+--------+--+--------+--+   Summary: Right Carotid: Velocities in the right ICA are consistent with a 80-99%                stenosis. Significant Increase seen in the Right ICA, narrowing                seen beginning in the bulb area. Left Carotid: Velocities in the left ICA are consistent with a 1-39% stenosis. Vertebrals:  Bilateral vertebral arteries demonstrate antegrade flow. Subclavians: Normal flow hemodynamics were seen in bilateral subclavian              arteries. *See table(s) above for measurements and observations.  Electronically signed by Hortencia Pilar MD on 02/16/2019 at 4:53:45 PM.    Final     Assessment/Plan  Diabetes mellitus without complication ([place]) blood glucose control important in reducing the progression of atherosclerotic disease. Also, involved in wound healing. On appropriate medications.    Hyperlipidemia lipid control important in reducing the progression of atherosclerotic disease. Continue statin therapy   Carotid stenosis, right I have independently reviewed her CT angiogram and she does in fact have greater than 80% recurrent stenosis of the right carotid system.  This appears to be just proximal to her endarterectomy site and is worse which may be from a clamp site hyperplastic lesion.  This has quickly progressed over a years time and is now high-grade.  At this point, it is clear that her best course of action would be carotid stent placement for high-grade recurrent stenosis I discussed the differences between carotid endarterectomy and carotid stent placement.  She will continue her aspirin, Plavix, and statin agent.    Leotis Pain, MD  03/14/2019 2:14 PM    This note was created with Dragon medical transcription system.  Any errors  from dictation are purely unintentional

## 2019-03-14 NOTE — Patient Instructions (Signed)
Carotid Angioplasty With Stent Carotid angioplasty with stent is a procedure to open or widen an artery in the neck (carotid artery) that has become narrowed. This is done by inflating a small balloon inside the artery and then placing a small piece of metal that looks like a coil or spring (stent) inside the artery. The stent helps keep the artery open by supporting the artery walls. The carotid arteries supply blood to the brain. When fats, cholesterol, and other materials (plaque) build up in an artery, the artery becomes narrow and can become blocked. This can reduce or block blood flow to certain areas of the brain, which can cause serious health problems, including stroke. Tell a health care provider about:  Any allergies you have.  All medicines you are taking, including vitamins, herbs, eye drops, creams, and over-the-counter medicines.  Any problems you or family members have had with anesthetic medicines.  Any blood disorders you have.  Any surgeries you have had.  Any medical conditions you have.  Whether you are pregnant or may be pregnant. What are the risks? Generally, this is a safe procedure. However, problems may occur, including:  Infection.  Bleeding.  Allergic reactions to medicines or dyes.  Damage to other structures or organs, or to the carotid artery itself.  The carotid artery becoming blocked again.  A collection of blood under the skin (hematoma) around the stent site that gets larger.  A blood clot in another part of the body.  Kidney injury.  Stroke.  Heart attack. What happens before the procedure?  Ask your health care provider about: ? Changing or stopping your regular medicines. This is especially important if you are taking diabetes medicines or blood thinners. ? Whether aspirin is recommended before this procedure. ? Taking over-the-counter medicines, vitamins, herbs, and supplements.  Follow instructions from your health care provider  about eating or drinking restrictions.  Do not use any products that contain nicotine or tobacco for 4 weeks before the procedure. These products include cigarettes, e-cigarettes, and chewing tobacco. If you need help quitting, ask your health care provider.  Ask your health care provider what steps will be taken to help prevent infection. These may include: ? Removing hair at the surgery site. ? Washing skin with a germ-killing soap. ? Taking antibiotic medicine.  You may have blood tests and imaging tests done.  Plan to have someone take you home from the hospital or clinic.  If you will be going home right after the procedure, plan to have someone with you for 24 hours. What happens during the procedure?   An IV will be inserted into one of your veins.  You may be given one or more of the following: ? A medicine to help you relax (sedative). ? A medicine to numb the area where the catheter will be inserted (local anesthetic).  Most commonly, an incision will be made in your groin. In some cases, an incision may be made in your wrist or forearm instead of your groin.  A small, thin tube (catheter) will be inserted through your incision, into an artery. The catheter will be threaded upward into your carotid artery. An X-ray machine (fluoroscope) will help your health care provider guide the catheter to the correct place in your artery.  Dye will be injected into the catheter and will travel to the narrow or blocked part of your carotid artery.  X-ray images will be taken of how the dye flows through your artery. While the images   are being taken, you may be given instructions about breathing, swallowing, moving, or talking.  A filter (distal protection device) will be inserted into your artery. This will be used to catch plaque that comes loose in your artery during the procedure. This reduces the risk of plaque moving into your brain.  A small balloon will be inserted into your  artery. The balloon will be inflated for a few seconds to widen your artery and will then be removed.  The stent will be placed in your artery.  A second small balloon will be inserted into your artery and inflated. This expands the stent inside of your artery so that the stent holds up the artery walls. The balloon will then be removed.  The catheter and the distal protection device will be removed from your artery.  Your incision may be closed with stitches (sutures), skin glue, or adhesive tape.  A bandage (dressing) will be placed over your incision. The procedure may vary among health care providers and hospitals. What happens after the procedure?  Your blood pressure, heart rate, breathing rate, and blood oxygen level will be monitored until you leave the hospital or clinic.  You may continue to receive fluids and medicines through an IV.  You may need to have pressure placed on the incision site to prevent bleeding.  You will need to keep the area still for a few hours, or as long as directed by your health care provider. If the procedure was done in the groin, you will be instructed not to bend or cross your legs.  You may have some pain. Pain medicines will be available to help you.  You may have a test that uses sound waves to take pictures (ultrasound) of the carotid artery. This can be compared to future tests to check for changes in the artery.  Do not drive for 24 hours. Summary  Carotid angioplasty with stent is a procedure to open or widen an artery in the neck (carotid artery) that has become narrowed.  The procedure is done to lower the risk of problems that can result from reduced blood flow to the brain, including a stroke.  The stent placed inside the artery will help keep the artery open by supporting the artery walls.  Follow instructions from your health care provider about taking medicines and about eating and drinking before the procedure. This  information is not intended to replace advice given to you by your health care provider. Make sure you discuss any questions you have with your health care provider. Document Released: 12/01/2004 Document Revised: 05/17/2018 Document Reviewed: 05/12/2018 Elsevier Patient Education  2020 [employer].

## 2019-03-14 NOTE — Assessment & Plan Note (Signed)
lipid control important in reducing the progression of atherosclerotic disease. Continue statin therapy  

## 2019-03-14 NOTE — Assessment & Plan Note (Signed)
blood glucose control important in reducing the progression of atherosclerotic disease. Also, involved in wound healing. On appropriate medications.  

## 2019-03-15 ENCOUNTER — Telehealth (INDEPENDENT_AMBULATORY_CARE_PROVIDER_SITE_OTHER): Payer: Self-pay

## 2019-03-15 ENCOUNTER — Encounter (INDEPENDENT_AMBULATORY_CARE_PROVIDER_SITE_OTHER): Payer: Self-pay

## 2019-03-15 NOTE — Telephone Encounter (Signed)
Spoke with the patient and she is now scheduled with Dr. Lucky Cowboy for 03/27/2019 with a 10:45 am arrival time to the [facility]. Patient will do her Covid testing on 03/23/2019 between 12:30-2:30 pm at the [facility]. Pre-procedure instructions were discussed and will be mailed to the patient.

## 2019-03-15 NOTE — Telephone Encounter (Signed)
Patient will do her Covid test and pre-op on 03/23/2019 at 1:00 pm at the [facility].

## 2019-03-23 ENCOUNTER — Other Ambulatory Visit (INDEPENDENT_AMBULATORY_CARE_PROVIDER_SITE_OTHER): Payer: Self-pay

## 2019-03-23 ENCOUNTER — Other Ambulatory Visit: Payer: Self-pay

## 2019-03-23 ENCOUNTER — Encounter
Admission: RE | Admit: 2019-03-23 | Discharge: 2019-03-23 | Disposition: A | Discharge status: Home / Self Care | Payer: BC Managed Care – PPO | Source: Ambulatory Visit | Attending: Vascular Surgery | Admitting: Vascular Surgery

## 2019-03-23 DIAGNOSIS — Z01818 Encounter for other preprocedural examination: Secondary | ICD-10-CM | POA: Diagnosis not present

## 2019-03-23 DIAGNOSIS — I447 Left bundle-branch block, unspecified: Secondary | ICD-10-CM | POA: Insufficient documentation

## 2019-03-23 DIAGNOSIS — Z20828 Contact with and (suspected) exposure to other viral communicable diseases: Secondary | ICD-10-CM | POA: Diagnosis not present

## 2019-03-23 LAB — CREATININE, SERUM
Creatinine, Ser: 0.65 mg/dL (ref 0.44–1.00)
GFR calc Af Amer: >60 mL/min (ref >60)
GFR calc non Af Amer: >60 mL/min (ref >60)

## 2019-03-23 LAB — BUN: BUN: 21 mg/dL (ref 8–23)

## 2019-03-24 LAB — SARS CORONAVIRUS 2 (TAT 6-24 HRS): SARS Coronavirus 2: NEGATIVE

## 2019-03-26 ENCOUNTER — Other Ambulatory Visit (INDEPENDENT_AMBULATORY_CARE_PROVIDER_SITE_OTHER): Payer: Self-pay | Admitting: Nurse Practitioner

## 2019-03-26 MED ORDER — CEFAZOLIN SODIUM-DEXTROSE 2-4 GM/100ML-% IV SOLN
2.0000 g | Freq: Once | INTRAVENOUS | Status: AC
  Administered 2019-03-27: 12:00:00 2 g via INTRAVENOUS

## 2019-03-27 ENCOUNTER — Other Ambulatory Visit: Payer: Self-pay

## 2019-03-27 ENCOUNTER — Encounter: Payer: Self-pay | Admitting: *Deleted

## 2019-03-27 ENCOUNTER — Encounter
Admission: RE | Disposition: A | Discharge status: Home / Self Care | Payer: Self-pay | Source: Home / Self Care | Attending: Vascular Surgery

## 2019-03-27 ENCOUNTER — Inpatient Hospital Stay
Admission: RE | Admit: 2019-03-27 | Discharge: 2019-03-28 | DRG: 036 | Disposition: A | Discharge status: Home / Self Care | Payer: BC Managed Care – PPO | Attending: Vascular Surgery | Admitting: Vascular Surgery

## 2019-03-27 DIAGNOSIS — E119 Type 2 diabetes mellitus without complications: Secondary | ICD-10-CM | POA: Diagnosis present

## 2019-03-27 DIAGNOSIS — I10 Essential (primary) hypertension: Secondary | ICD-10-CM | POA: Diagnosis present

## 2019-03-27 DIAGNOSIS — G2581 Restless legs syndrome: Secondary | ICD-10-CM | POA: Diagnosis present

## 2019-03-27 DIAGNOSIS — I6521 Occlusion and stenosis of right carotid artery: Secondary | ICD-10-CM | POA: Diagnosis present

## 2019-03-27 DIAGNOSIS — F329 Major depressive disorder, single episode, unspecified: Secondary | ICD-10-CM | POA: Diagnosis present

## 2019-03-27 DIAGNOSIS — J449 Chronic obstructive pulmonary disease, unspecified: Secondary | ICD-10-CM | POA: Diagnosis present

## 2019-03-27 DIAGNOSIS — Z7982 Long term (current) use of aspirin: Secondary | ICD-10-CM | POA: Diagnosis not present

## 2019-03-27 DIAGNOSIS — K219 Gastro-esophageal reflux disease without esophagitis: Secondary | ICD-10-CM | POA: Diagnosis present

## 2019-03-27 DIAGNOSIS — Z7984 Long term (current) use of oral hypoglycemic drugs: Secondary | ICD-10-CM

## 2019-03-27 DIAGNOSIS — Z7902 Long term (current) use of antithrombotics/antiplatelets: Secondary | ICD-10-CM

## 2019-03-27 DIAGNOSIS — Z79899 Other long term (current) drug therapy: Secondary | ICD-10-CM | POA: Diagnosis not present

## 2019-03-27 HISTORY — PX: CAROTID PTA/STENT INTERVENTION: CATH118231

## 2019-03-27 LAB — GLUCOSE, CAPILLARY
Glucose-Capillary: 236 mg/dL — ABNORMAL HIGH (ref 70–99)
Glucose-Capillary: 208 mg/dL — ABNORMAL HIGH (ref 70–99)
Glucose-Capillary: 198 mg/dL — ABNORMAL HIGH (ref 70–99)
Glucose-Capillary: 291 mg/dL — ABNORMAL HIGH (ref 70–99)

## 2019-03-27 LAB — POCT ACTIVATED CLOTTING TIME: Activated Clotting Time: 213 seconds

## 2019-03-27 SURGERY — CAROTID PTA/STENT INTERVENTION
Anesthesia: Moderate Sedation | Laterality: Right

## 2019-03-27 MED ORDER — METOPROLOL TARTRATE 5 MG/5ML IV SOLN: 2.0000 mg | INTRAVENOUS | Status: DC | PRN

## 2019-03-27 MED ORDER — LINAGLIPTIN 5 MG PO TABS
5.0000 mg | ORAL_TABLET | Freq: Every day | ORAL | Status: DC
  Administered 2019-03-28: 09:00:00 5 mg via ORAL
  Filled 2019-03-27: qty 1

## 2019-03-27 MED ORDER — FAMOTIDINE IN NACL 20-0.9 MG/50ML-% IV SOLN
20.0000 mg | Freq: Two times a day (BID) | INTRAVENOUS | Status: DC
  Administered 2019-03-27 (×2): 20 mg via INTRAVENOUS
  Filled 2019-03-27 (×3): qty 50

## 2019-03-27 MED ORDER — ATROPINE SULFATE 1 MG/10ML IJ SOSY
PREFILLED_SYRINGE | INTRAMUSCULAR | Status: AC
  Filled 2019-03-27: qty 20

## 2019-03-27 MED ORDER — VITAMIN E 180 MG (400 UNIT) PO CAPS
400.0000 [IU] | ORAL_CAPSULE | Freq: Every day | ORAL | Status: DC
  Filled 2019-03-27 (×2): qty 1

## 2019-03-27 MED ORDER — MIDAZOLAM HCL 2 MG/ML PO SYRP: 8.0000 mg | ORAL_SOLUTION | Freq: Once | ORAL | Status: DC | PRN

## 2019-03-27 MED ORDER — ACETAMINOPHEN 325 MG RE SUPP
325.0000 mg | RECTAL | Status: DC | PRN
  Filled 2019-03-27: qty 2

## 2019-03-27 MED ORDER — DIPHENHYDRAMINE HCL 50 MG/ML IJ SOLN: 50.0000 mg | Freq: Once | INTRAMUSCULAR | Status: DC | PRN

## 2019-03-27 MED ORDER — LABETALOL HCL 5 MG/ML IV SOLN: 10.0000 mg | INTRAVENOUS | Status: DC | PRN

## 2019-03-27 MED ORDER — CEFAZOLIN SODIUM-DEXTROSE 2-4 GM/100ML-% IV SOLN
INTRAVENOUS | Status: AC
  Administered 2019-03-27: 2 g via INTRAVENOUS
  Filled 2019-03-27: qty 100

## 2019-03-27 MED ORDER — VITAMIN B-12 1000 MCG PO TABS
1000.0000 ug | ORAL_TABLET | Freq: Every day | ORAL | Status: DC
  Filled 2019-03-27: qty 1

## 2019-03-27 MED ORDER — OXYCODONE-ACETAMINOPHEN 5-325 MG PO TABS: 1.0000 | ORAL_TABLET | ORAL | Status: DC | PRN

## 2019-03-27 MED ORDER — VITAMIN B-6 50 MG PO TABS
200.0000 mg | ORAL_TABLET | Freq: Every day | ORAL | Status: DC
  Filled 2019-03-27 (×2): qty 4

## 2019-03-27 MED ORDER — CLOPIDOGREL BISULFATE 75 MG PO TABS
75.0000 mg | ORAL_TABLET | Freq: Every day | ORAL | Status: DC
  Administered 2019-03-28: 75 mg via ORAL
  Filled 2019-03-27: qty 1

## 2019-03-27 MED ORDER — PRAMIPEXOLE DIHYDROCHLORIDE 0.25 MG PO TABS
0.2500 mg | ORAL_TABLET | Freq: Two times a day (BID) | ORAL | Status: DC
  Administered 2019-03-27 – 2019-03-28 (×2): 0.25 mg via ORAL
  Filled 2019-03-27 (×4): qty 1

## 2019-03-27 MED ORDER — PRAMIPEXOLE DIHYDROCHLORIDE 1 MG PO TABS
1.0000 mg | ORAL_TABLET | Freq: Every day | ORAL | Status: DC
  Administered 2019-03-27: 1 mg via ORAL
  Filled 2019-03-27 (×2): qty 1

## 2019-03-27 MED ORDER — PIOGLITAZONE HCL 30 MG PO TABS
30.0000 mg | ORAL_TABLET | Freq: Every day | ORAL | Status: DC
  Filled 2019-03-27: qty 1

## 2019-03-27 MED ORDER — GUAIFENESIN-DM 100-10 MG/5ML PO SYRP: 15.0000 mL | ORAL_SOLUTION | ORAL | Status: DC | PRN

## 2019-03-27 MED ORDER — FAMOTIDINE 20 MG PO TABS: 40.0000 mg | ORAL_TABLET | Freq: Once | ORAL | Status: DC | PRN

## 2019-03-27 MED ORDER — METHYLPREDNISOLONE SODIUM SUCC 125 MG IJ SOLR: 125.0000 mg | Freq: Once | INTRAMUSCULAR | Status: DC | PRN

## 2019-03-27 MED ORDER — HYDRALAZINE HCL 20 MG/ML IJ SOLN: 5.0000 mg | INTRAMUSCULAR | Status: DC | PRN

## 2019-03-27 MED ORDER — CLOPIDOGREL BISULFATE 75 MG PO TABS: 75.0000 mg | ORAL_TABLET | Freq: Every day | ORAL | Status: DC

## 2019-03-27 MED ORDER — SODIUM CHLORIDE 0.9 % IV SOLN: INTRAVENOUS | Status: DC

## 2019-03-27 MED ORDER — POTASSIUM CHLORIDE CRYS ER 20 MEQ PO TBCR: 20.0000 meq | EXTENDED_RELEASE_TABLET | Freq: Every day | ORAL | Status: DC | PRN

## 2019-03-27 MED ORDER — ASPIRIN EC 81 MG PO TBEC: 81.0000 mg | DELAYED_RELEASE_TABLET | Freq: Every day | ORAL | Status: DC

## 2019-03-27 MED ORDER — PHENYLEPHRINE HCL (PRESSORS) 10 MG/ML IV SOLN
INTRAVENOUS | Status: AC
  Filled 2019-03-27: qty 1

## 2019-03-27 MED ORDER — ESMOLOL HCL-SODIUM CHLORIDE 2000 MG/100ML IV SOLN
INTRAVENOUS | Status: AC
  Filled 2019-03-27: qty 100

## 2019-03-27 MED ORDER — FENTANYL CITRATE (PF) 100 MCG/2ML IJ SOLN
INTRAMUSCULAR | Status: DC | PRN
  Administered 2019-03-27: 50 ug via INTRAVENOUS

## 2019-03-27 MED ORDER — HYDROMORPHONE HCL 1 MG/ML IJ SOLN: 1.0000 mg | Freq: Once | INTRAMUSCULAR | Status: DC | PRN

## 2019-03-27 MED ORDER — ASPIRIN EC 81 MG PO TBEC
81.0000 mg | DELAYED_RELEASE_TABLET | Freq: Every day | ORAL | Status: DC
  Administered 2019-03-28: 81 mg via ORAL
  Filled 2019-03-27: qty 1

## 2019-03-27 MED ORDER — DOPAMINE-DEXTROSE 3.2-5 MG/ML-% IV SOLN
INTRAVENOUS | Status: AC
  Filled 2019-03-27: qty 250

## 2019-03-27 MED ORDER — HEPARIN SODIUM (PORCINE) 1000 UNIT/ML IJ SOLN
INTRAMUSCULAR | Status: AC
  Filled 2019-03-27: qty 1

## 2019-03-27 MED ORDER — MIDAZOLAM HCL 5 MG/5ML IJ SOLN
INTRAMUSCULAR | Status: AC
  Filled 2019-03-27: qty 10

## 2019-03-27 MED ORDER — RENA-VITE PO TABS
1.0000 | ORAL_TABLET | Freq: Every day | ORAL | Status: DC
  Filled 2019-03-27: qty 1

## 2019-03-27 MED ORDER — SODIUM CHLORIDE 0.9 % IV SOLN
INTRAVENOUS | Status: DC
  Administered 2019-03-27: 11:00:00 via INTRAVENOUS

## 2019-03-27 MED ORDER — SODIUM CHLORIDE 0.9 % IV SOLN: 500.0000 mL | Freq: Once | INTRAVENOUS | Status: DC | PRN

## 2019-03-27 MED ORDER — LORATADINE 10 MG PO TABS
10.0000 mg | ORAL_TABLET | Freq: Every day | ORAL | Status: DC
  Filled 2019-03-27: qty 1

## 2019-03-27 MED ORDER — MORPHINE SULFATE (PF) 4 MG/ML IV SOLN: 2.0000 mg | INTRAVENOUS | Status: DC | PRN

## 2019-03-27 MED ORDER — ALBUTEROL SULFATE (2.5 MG/3ML) 0.083% IN NEBU: 2.5000 mg | INHALATION_SOLUTION | Freq: Four times a day (QID) | RESPIRATORY_TRACT | Status: DC | PRN

## 2019-03-27 MED ORDER — SERTRALINE HCL 50 MG PO TABS
50.0000 mg | ORAL_TABLET | Freq: Every day | ORAL | Status: DC
  Administered 2019-03-28: 09:00:00 50 mg via ORAL
  Filled 2019-03-27: qty 1

## 2019-03-27 MED ORDER — ACETAMINOPHEN 325 MG PO TABS
325.0000 mg | ORAL_TABLET | ORAL | Status: DC | PRN
  Administered 2019-03-28: 650 mg via ORAL
  Filled 2019-03-27: qty 2

## 2019-03-27 MED ORDER — ONDANSETRON HCL 4 MG/2ML IJ SOLN: 4.0000 mg | Freq: Four times a day (QID) | INTRAMUSCULAR | Status: DC | PRN

## 2019-03-27 MED ORDER — PANTOPRAZOLE SODIUM 40 MG PO TBEC
40.0000 mg | DELAYED_RELEASE_TABLET | Freq: Every day | ORAL | Status: DC
  Filled 2019-03-27: qty 1

## 2019-03-27 MED ORDER — GLIMEPIRIDE 4 MG PO TABS
4.0000 mg | ORAL_TABLET | Freq: Every day | ORAL | Status: DC
  Administered 2019-03-28: 4 mg via ORAL
  Filled 2019-03-27 (×2): qty 1

## 2019-03-27 MED ORDER — HEPARIN SODIUM (PORCINE) 1000 UNIT/ML IJ SOLN
INTRAMUSCULAR | Status: DC | PRN
  Administered 2019-03-27: 3000 [IU] via INTRAVENOUS
  Administered 2019-03-27: 6000 [IU] via INTRAVENOUS

## 2019-03-27 MED ORDER — CEFAZOLIN SODIUM-DEXTROSE 2-4 GM/100ML-% IV SOLN
2.0000 g | Freq: Three times a day (TID) | INTRAVENOUS | Status: AC
  Administered 2019-03-27 – 2019-03-28 (×2): 2 g via INTRAVENOUS
  Filled 2019-03-27 (×2): qty 100

## 2019-03-27 MED ORDER — MOMETASONE FURO-FORMOTEROL FUM 200-5 MCG/ACT IN AERO
2.0000 | INHALATION_SPRAY | Freq: Two times a day (BID) | RESPIRATORY_TRACT | Status: DC
  Filled 2019-03-27: qty 8.8

## 2019-03-27 MED ORDER — VITAMIN D (ERGOCALCIFEROL) 1.25 MG (50000 UNIT) PO CAPS: 50000.0000 [IU] | ORAL_CAPSULE | ORAL | Status: DC

## 2019-03-27 MED ORDER — MAGNESIUM SULFATE 2 GM/50ML IV SOLN: 2.0000 g | Freq: Every day | INTRAVENOUS | Status: DC | PRN

## 2019-03-27 MED ORDER — ALUM & MAG HYDROXIDE-SIMETH 200-200-20 MG/5ML PO SUSP: 15.0000 mL | ORAL | Status: DC | PRN

## 2019-03-27 MED ORDER — PHENOL 1.4 % MT LIQD
1.0000 | OROMUCOSAL | Status: DC | PRN
  Filled 2019-03-27: qty 177

## 2019-03-27 MED ORDER — ALPRAZOLAM 0.25 MG PO TABS: 0.2500 mg | ORAL_TABLET | Freq: Two times a day (BID) | ORAL | Status: DC | PRN

## 2019-03-27 MED ORDER — VIT C-CHOLECALCIFEROL-ROSE HIP 500-1000-20 MG-UNIT-MG PO CAPS: 1.0000 | ORAL_CAPSULE | Freq: Every day | ORAL | Status: DC

## 2019-03-27 MED ORDER — FENTANYL CITRATE (PF) 100 MCG/2ML IJ SOLN
INTRAMUSCULAR | Status: AC
  Filled 2019-03-27: qty 4

## 2019-03-27 MED ORDER — MIDAZOLAM HCL 2 MG/2ML IJ SOLN
INTRAMUSCULAR | Status: DC | PRN
  Administered 2019-03-27: 2 mg via INTRAVENOUS

## 2019-03-27 MED ORDER — ATORVASTATIN CALCIUM 10 MG PO TABS
10.0000 mg | ORAL_TABLET | Freq: Every day | ORAL | Status: DC
  Administered 2019-03-27: 10 mg via ORAL
  Filled 2019-03-27: qty 1

## 2019-03-27 MED ORDER — OMEGA-3-ACID ETHYL ESTERS 1 G PO CAPS
1.0000 g | ORAL_CAPSULE | Freq: Every day | ORAL | Status: DC
  Filled 2019-03-27: qty 1

## 2019-03-27 SURGICAL SUPPLY — 17 items
BALLN VTRAC 4.5X30X135 (BALLOONS) ×3
BALLOON VTRAC 4.5X30X135 (BALLOONS) IMPLANT
CATH ANGIO 5F 100CM .035 PIG (CATHETERS) ×2 IMPLANT
CATH BEACON 5 .035 100 H1 TIP (CATHETERS) ×2 IMPLANT
COVER PROBE U/S 5X48 (MISCELLANEOUS) ×2 IMPLANT
DEVICE EMBOSHIELD NAV6 4.0-7.0 (FILTER) ×2 IMPLANT
DEVICE PRESTO INFLATION (MISCELLANEOUS) ×3 IMPLANT
DEVICE STARCLOSE SE CLOSURE (Vascular Products) ×2 IMPLANT
DEVICE TORQUE .025-.038 (MISCELLANEOUS) ×2 IMPLANT
GLIDEWIRE ANGLED SS 035X260CM (WIRE) ×2 IMPLANT
KIT CAROTID MANIFOLD (MISCELLANEOUS) ×2 IMPLANT
PACK ANGIOGRAPHY (CUSTOM PROCEDURE TRAY) ×2 IMPLANT
SHEATH BRITE TIP 6FRX11 (SHEATH) ×2 IMPLANT
SHEATH SHUTTLE SELECT 6F (SHEATH) ×2 IMPLANT
STENT XACT CAR 9-7X40X136 (Permanent Stent) ×2 IMPLANT
WIRE G VAS 035X260 STIFF (WIRE) ×2 IMPLANT
WIRE J 3MM .035X145CM (WIRE) ×2 IMPLANT

## 2019-03-27 NOTE — OR Nursing (Signed)
Daughter Crystal [phone]

## 2019-03-27 NOTE — Progress Notes (Signed)
PHARMACIST - PHYSICIAN ORDER COMMUNICATION  CONCERNING: P&T Medication Policy on Herbal Medications  DESCRIPTION:  This patient's order for:  Vit C/Cholecalciferol/ Rose-Hips  has been noted.  This product(s) is classified as an "herbal" or natural product. Due to a lack of definitive safety studies or FDA approval, nonstandard manufacturing practices, plus the potential risk of unknown drug-drug interactions while on inpatient medications, the Pharmacy and Therapeutics Committee does not permit the use of "herbal" or natural products of this type within [facility].   ACTION TAKEN: The pharmacy department is unable to verify this order at this time and your patient has been informed of this safety policy. Please reevaluate patient's clinical condition at discharge and address if the herbal or natural product(s) should be resumed at that time.

## 2019-03-27 NOTE — Op Note (Signed)
OPERATIVE NOTE DATE: 03/27/2019  PROCEDURE: 1.  Ultrasound guidance for vascular access right femoral artery 2.  Placement of a 9 mm proximal, 7 mm distal 4 cm long Exact stent with the use of the NAV-6 embolic protection device in the right carotid artery  PRE-OPERATIVE DIAGNOSIS: 1. High grade recurrent carotid artery stenosis.  POST-OPERATIVE DIAGNOSIS:  Same as above  SURGEON: Leotis Pain, MD  ASSISTANT(S):  none  ANESTHESIA: local/MCS  ESTIMATED BLOOD LOSS:  10 cc  CONTRAST: 75 cc  FLUORO TIME: 6.1 minutes  MODERATE CONSCIOUS SEDATION TIME:  Approximately 45 minutes using 2 mg of Versed and 50 mcg of Fentanyl  FINDING(S): 1.   Need to 85% stenosis of the right carotid artery   SPECIMEN(S):   none  INDICATIONS:   Patient is a 68 y.o. female who presents with high-grade recurrent right carotid artery stenosis after previous endarterectomy.  The patient has already had a right carotid endarterectomy and this represents a recurrent stenosis poorly treated with repeat surgery and carotid artery stenting was felt to be preferred to endarterectomy for that reason.  Risks and benefits were discussed and informed consent was obtained.   DESCRIPTION: After obtaining full informed written consent, the patient was brought back to the vascular suite and placed supine upon the table.  The patient received IV antibiotics prior to induction. Moderate conscious sedation was administered during a face to face encounter with the patient throughout the procedure with my supervision of the RN administering medicines and monitoring the patients vital signs and mental status throughout from the start of the procedure until the patient was taken to the recovery room.  After obtaining adequate anesthesia, the patient was prepped and draped in the standard fashion.   The right femoral artery was visualized with ultrasound and found to be widely patent. It was then accessed under direct ultrasound  guidance without difficulty with a Seldinger needle. A permanent image was recorded. A J-wire was placed and we then placed a 6 French sheath. The patient was then heparinized and a total of 9 units of intravenous heparin were given and an ACT was checked to confirm successful anticoagulation. A pigtail catheter was then placed into the ascending aorta. This showed reasonably normal origins of the great vessels with a type II arch. I then selectively cannulated the innominate artery without difficulty with a headhunter catheter and advanced into the mid right common carotid artery.  Cervical and cerebral carotid angiography was then performed. There was essentially no anterior cerebral flow initially with a reasonably normal middle cerebral artery. The carotid bifurcation demonstrated 80 to 85% stenosis of the distal common carotid artery just before the bifurcation with a moderate degree of stenosis as well in the proximal internal carotid artery in the 60 to 70% range.  I then advanced into the external carotid artery with a Glidewire and the headhunter catheter and then exchanged for the Amplatz Super Stiff wire. Over the Amplatz Super Stiff wire, a 6 [place] shuttle sheath was placed into the mid common carotid artery. I then used the NAV-6  Embolic protection device and crossed the lesion and parked this in the distal internal carotid artery at the base of the skull.  I then selected a 9 mm proximal, 7 mm distal 4 cm long exact stent. This was deployed across the lesion encompassing it in its entirety. A 4.5 mm diameter by 3 cm length balloon was used to post dilate the stent. Only about a 15-20 % residual  stenosis was present after angioplasty. Completion angiogram showed normal intracranial filling without new defects. At this point I elected to terminate the procedure. The sheath was removed and StarClose closure device was deployed in the right femoral artery with excellent hemostatic result. The patient was  taken to the recovery room in stable condition having tolerated the procedure well.  COMPLICATIONS: none  CONDITION: stable  Leotis Pain 03/27/2019 1:59 PM   This note was created with Dragon Medical transcription system. Any errors in dictation are purely unintentional.

## 2019-03-27 NOTE — H&P (Signed)
[facility] History & Physical Update  The patient was interviewed and re-examined.  The patient's previous History and Physical has been reviewed and is unchanged.  There is no change in the plan of care. We plan to proceed with the scheduled procedure.  Leotis Pain, MD  03/27/2019, 11:15 AM

## 2019-03-28 ENCOUNTER — Encounter: Payer: Self-pay | Admitting: Vascular Surgery

## 2019-03-28 DIAGNOSIS — I6521 Occlusion and stenosis of right carotid artery: Principal | ICD-10-CM

## 2019-03-28 LAB — BASIC METABOLIC PANEL
Anion gap: 9 (ref 5–15)
BUN: 18 mg/dL (ref 8–23)
CO2: 26 mmol/L (ref 22–32)
Calcium: 8.6 mg/dL — ABNORMAL LOW (ref 8.9–10.3)
Chloride: 105 mmol/L (ref 98–111)
Creatinine, Ser: 0.64 mg/dL (ref 0.44–1.00)
GFR calc Af Amer: >60 mL/min (ref >60)
GFR calc non Af Amer: >60 mL/min (ref >60)
Glucose, Bld: 213 mg/dL — ABNORMAL HIGH (ref 70–99)
Potassium: 3.8 mmol/L (ref 3.5–5.1)
Sodium: 140 mmol/L (ref 135–145)

## 2019-03-28 LAB — CBC
HCT: 37.6 % (ref 36.0–46.0)
Hemoglobin: 11.6 g/dL — ABNORMAL LOW (ref 12.0–15.0)
MCH: 28.4 pg (ref 26.0–34.0)
MCHC: 30.9 g/dL (ref 30.0–36.0)
MCV: 91.9 fL (ref 80.0–100.0)
Platelets: 213 10*3/uL (ref 150–400)
RBC: 4.09 MIL/uL (ref 3.87–5.11)
RDW: 13.1 % (ref 11.5–15.5)
WBC: 6.9 10*3/uL (ref 4.0–10.5)
nRBC: 0 % (ref 0.0–0.2)

## 2019-03-28 MED ORDER — CHLORHEXIDINE GLUCONATE CLOTH 2 % EX PADS: 6.0000 | MEDICATED_PAD | Freq: Every day | CUTANEOUS | Status: DC

## 2019-03-28 NOTE — Discharge Instructions (Signed)
Vascular Surgery Discharge Instructions 1) You may shower as of tomorrow.  Please keep your groin clean and dry. 2) No driving for at least a week.

## 2019-03-28 NOTE — Discharge Summary (Signed)
[facility]    Discharge Summary  Patient ID:  Tanya Ryan MRN: [id number] DOB/AGE: 1950/12/04 68 y.o.  Admit date: 03/27/2019 Discharge date: 03/28/2019 Date of Surgery: 03/27/2019 Surgeon: Surgeon(s): Algernon Huxley, MD  Admission Diagnosis: Carotid stenosis, right [I65.21]  Discharge Diagnoses:  Carotid stenosis, right [I65.21]  Secondary Diagnoses: Past Medical History:  Diagnosis Date  . Anxiety   . Asthma   . Cancer ([place])    skin. on face  . Complication of anesthesia 1990   pt woke up in middle of bunionectomy.   Marland Kitchen COPD (chronic obstructive pulmonary disease) ([place])   . Depression   . Diabetes mellitus without complication ([place])   . Dyspnea   . Dysrhythmia 01/2018   LBBB  . GERD (gastroesophageal reflux disease)   . History of broken nose 1970  . Hypertension   . Restless leg syndrome    Procedure(s): CAROTID PTA/STENT INTERVENTION  Discharged Condition: Good  HPI / Hospital Course:  Tanya Ryan is a 68 y.o. female who presents with high-grade recurrent right carotid artery stenosis after previous endarterectomy.  The patient has already had a right carotid endarterectomy and this represents a recurrent stenosis poorly treated with repeat surgery and carotid artery stenting was felt to be preferred to endarterectomy for that reason.  On 03/27/19, the patient underwent:  1.  Ultrasound guidance for vascular access right femoral artery 2.  Placement of a 9 mm proximal, 7 mm distal 4 cm long Exact stent with the use of the NAV-6 embolic protection device in the right carotid artery  She tolerated the procedure fine was transferred from the endovascular suite to the ICU for observation overnight.  The patient's night a procedure was unremarkable.  During the patient's brief inpatient stay, her diet was advanced without complication, she was urinating independently, her discomfort was controlled with use of p.o. pain medication  and she was ambulating at baseline.  Postop day #1/day of discharge, the patient was afebrile with stable vital signs and her physical exam was unremarkable.  Physical exam:  Alert and oriented x3 Face: Symmetrical Neck: No swelling/discoloration noted Cardiovascular: Regular rhythm and rate Pulmonary: Good auscultation bilaterally Abdomen: Soft, nondistended, nontender, positive bowel sounds Right groin: Access site with some ecchymosis however there is no swelling or drainage noted Extremities: Bilateral lower extremities are warm distally to the toes. Neuro: Upper/lower extremities 5 out of 5, motor/sensory intact  Labs as below  Complications: None  Consults: None  Significant Diagnostic Studies: CBC Lab Results  Component Value Date   WBC 6.9 03/28/2019   HGB 11.6 (L) 03/28/2019   HCT 37.6 03/28/2019   MCV 91.9 03/28/2019   PLT 213 03/28/2019   BMET    Component Value Date/Time   NA 140 03/28/2019 0433   K 3.8 03/28/2019 0433   CL 105 03/28/2019 0433   CO2 26 03/28/2019 0433   GLUCOSE 213 (H) 03/28/2019 0433   BUN 18 03/28/2019 0433   CREATININE 0.64 03/28/2019 0433   CALCIUM 8.6 (L) 03/28/2019 0433   GFRNONAA >60 03/28/2019 0433   GFRAA >60 03/28/2019 0433   COAG Lab Results  Component Value Date   INR 1.00 03/10/2018   Disposition:  Discharge to :Home  Allergies as of 03/28/2019      Reactions   Metformin Diarrhea      Medication List    TAKE these medications   ALPRAZolam 0.25 MG tablet Commonly known as: XANAX Take 0.25 mg by mouth 2 (two)  times daily as needed for anxiety.   Aspirin Low Dose 81 MG EC tablet Generic drug: aspirin TAKE 1 TABLET BY MOUTH EVERY DAY   atorvastatin 10 MG tablet Commonly known as: LIPITOR Take 1 tablet (10 mg total) by mouth daily at 6 PM.   b complex vitamins tablet Take 1 tablet by mouth daily.   clopidogrel 75 MG tablet Commonly known as: PLAVIX TAKE 1 TABLET BY MOUTH IN THE MORNING WITH BREAKFAST    Fish Oil 1000 MG Caps Take 1 capsule by mouth daily.   Fluticasone-Salmeterol 250-50 MCG/DOSE Aepb Commonly known as: ADVAIR Inhale 1 puff into the lungs 2 (two) times daily.   glimepiride 4 MG tablet Commonly known as: AMARYL Take 4 mg by mouth daily with breakfast.   glucose blood test strip Use once daily Use as instructed.   lidocaine-hydrocortisone 3-0.5 % Crea Commonly known as: ANAMANTEL HC Apply topically. Place in outer ear as needed (for psoriasis)   loratadine 10 MG tablet Commonly known as: CLARITIN Take 10 mg by mouth daily.   MULTI-VITAMIN DAILY PO Take by mouth.   naproxen sodium 220 MG tablet Commonly known as: ALEVE Take 220 mg by mouth 2 (two) times daily as needed.   omeprazole 20 MG capsule Commonly known as: PRILOSEC Take 20 mg by mouth daily.   Oscal 500/200 D-3 500-200 MG-UNIT tablet Generic drug: calcium-vitamin D Take 1 tablet by mouth.   pioglitazone 30 MG tablet Commonly known as: ACTOS Take 30 mg by mouth daily.   pramipexole 1 MG tablet Commonly known as: MIRAPEX Take 1 mg by mouth at bedtime.   pramipexole 0.25 MG tablet Commonly known as: MIRAPEX Take 0.25 mg by mouth 2 (two) times daily. .   Proventil HFA 108 (90 Base) MCG/ACT inhaler Generic drug: albuterol Inhale 2 puffs into the lungs every 6 (six) hours as needed (rescue inhaler).   pyridoxine 100 MG tablet Commonly known as: B-6 Take 200 mg by mouth daily.   sertraline 100 MG tablet Commonly known as: ZOLOFT Take 50 mg by mouth daily. Takes half a tablet in the morning   sitaGLIPtin 50 MG tablet Commonly known as: JANUVIA Take 50 mg by mouth daily.   vitamin B-12 1000 MCG tablet Commonly known as: CYANOCOBALAMIN Take 1,000 mcg by mouth daily.   Vitamin C & D3/Rose Hips [id number] MG-UNIT-MG Caps Generic drug: Vit C-Cholecalciferol-Rose Hip Take 1 tablet by mouth daily.   Vitamin D (Ergocalciferol) 1.25 MG (50000 UT) Caps capsule Commonly known as:  DRISDOL Take 50,000 Units by mouth every 7 (seven) days. On sunday   vitamin E 400 UNIT capsule Take 400 Units by mouth daily.      Verbal and written Discharge instructions given to the patient. Wound care per Discharge AVS Follow-up Information    Dew, Erskine Squibb, MD Follow up in 3 week(s).   Specialties: Vascular Surgery, Radiology, Interventional Cardiology Why: Can see Dew or Arna Medici. First post-op. Will need carotid duplex with visit.  Contact information: [place] [address] [phone]          Signed: Sela Hua, PA-C  03/28/2019, 12:57 PM

## 2019-03-28 NOTE — Progress Notes (Signed)
PAD removed, no complications. Will continue to monitor.

## 2019-03-28 NOTE — Progress Notes (Signed)
Pt given discharge instructions and verbalized understanding. Pt discharged home via wheelchair.

## 2019-03-28 NOTE — Progress Notes (Signed)
Inpatient Diabetes Program Recommendations  AACE/ADA: New Consensus Statement on Inpatient Glycemic Control   Target Ranges:  Prepandial:   less than 140 mg/dL      Peak postprandial:   less than 180 mg/dL (1-2 hours)      Critically ill patients:  140 - 180 mg/dL  Results for Tanya Ryan, ACHA (MRN [id number]) as of 03/28/2019 11:08  Ref. Range 03/27/2019 11:20 03/27/2019 13:31 03/27/2019 14:47 03/27/2019 21:10  Glucose-Capillary Latest Ref Range: 70 - 99 mg/dL 236 (H) 208 (H) 198 (H) 291 (H)    Review of Glycemic Control  Diabetes history: DM2 Outpatient Diabetes medications: Amaryl 4 mg QAM, Actos 30 mg QAM, Januvia 50 mg daily Current orders for Inpatient glycemic control:  Amaryl 4 mg QAM, Actos 30 mg daily, Tradjenta 5 mg daily  Inpatient Diabetes Program Recommendations:   Insulin-Correction: Please use Glycemic Control order set to order CBGs AC&HS with Novolog 0-9 units TID with meals and Novolog 0-5 units QHS.  HbgA1C: Please consider ordering an A1C to evaluate glycemic control over the past 2-3 months.   Thanks, Barnie Alderman, RN, MSN, CDE Diabetes Coordinator Inpatient Diabetes Program [phone] (Team Pager from 8am to 5pm)

## 2019-03-30 ENCOUNTER — Ambulatory Visit
Admission: RE | Admit: 2019-03-30 | Discharge: 2019-03-30 | Disposition: A | Discharge status: Home / Self Care | Payer: BC Managed Care – PPO | Source: Ambulatory Visit | Attending: Internal Medicine | Admitting: Internal Medicine

## 2019-03-30 DIAGNOSIS — Z1231 Encounter for screening mammogram for malignant neoplasm of breast: Secondary | ICD-10-CM | POA: Diagnosis not present

## 2019-05-02 ENCOUNTER — Other Ambulatory Visit (INDEPENDENT_AMBULATORY_CARE_PROVIDER_SITE_OTHER): Payer: Self-pay | Admitting: Nurse Practitioner

## 2019-05-02 DIAGNOSIS — Z9889 Other specified postprocedural states: Secondary | ICD-10-CM

## 2019-05-02 DIAGNOSIS — I6521 Occlusion and stenosis of right carotid artery: Secondary | ICD-10-CM

## 2019-05-02 DIAGNOSIS — Z95820 Peripheral vascular angioplasty status with implants and grafts: Secondary | ICD-10-CM

## 2019-05-03 ENCOUNTER — Other Ambulatory Visit: Payer: Self-pay

## 2019-05-03 ENCOUNTER — Encounter (INDEPENDENT_AMBULATORY_CARE_PROVIDER_SITE_OTHER): Payer: Self-pay | Admitting: Nurse Practitioner

## 2019-05-03 ENCOUNTER — Ambulatory Visit (INDEPENDENT_AMBULATORY_CARE_PROVIDER_SITE_OTHER): Payer: BC Managed Care – PPO | Admitting: Nurse Practitioner

## 2019-05-03 ENCOUNTER — Ambulatory Visit (INDEPENDENT_AMBULATORY_CARE_PROVIDER_SITE_OTHER): Payer: BC Managed Care – PPO

## 2019-05-03 VITALS — BP 142/76 | HR 77 | Resp 12 | Ht 63.0 in | Wt 196.0 lb

## 2019-05-03 DIAGNOSIS — I6521 Occlusion and stenosis of right carotid artery: Secondary | ICD-10-CM | POA: Diagnosis not present

## 2019-05-03 DIAGNOSIS — J449 Chronic obstructive pulmonary disease, unspecified: Secondary | ICD-10-CM

## 2019-05-03 DIAGNOSIS — Z95820 Peripheral vascular angioplasty status with implants and grafts: Secondary | ICD-10-CM

## 2019-05-03 DIAGNOSIS — Z9889 Other specified postprocedural states: Secondary | ICD-10-CM | POA: Diagnosis not present

## 2019-05-03 DIAGNOSIS — E785 Hyperlipidemia, unspecified: Secondary | ICD-10-CM

## 2019-05-03 NOTE — Progress Notes (Signed)
SUBJECTIVE:  Patient ID: Tanya Ryan, female    DOB: 10-04-1950, 68 y.o.   MRN: [id number] Chief Complaint  Patient presents with  . Follow-up    HPI  Tanya Ryan is a 68 y.o. female The patient is seen for follow up evaluation of carotid stenosis status post right carotid stent on 03/27/2019.  The patient previously had a right carotid endarterectomy which had a high-grade recurrent stenosis.  There were no post operative problems or complications related to the surgery.  The patient denies neck or incisional pain.  Patient groin site has healed well.  The patient denies interval amaurosis fugax. There is no recent history of TIA symptoms or focal motor deficits. There is no prior documented CVA.  The patient denies headache.  The patient is taking enteric-coated aspirin 81 mg daily.  The patient has a history of coronary artery disease, no recent episodes of angina or shortness of breath. The patient denies PAD or claudication symptoms. There is a history of hyperlipidemia which is being treated with a statin.   Carotid artery duplex reveals 1 to 39% stenosis of the right internal carotid artery with a patent stent.  No stenosis of the left.  Antegrade flow within the bilateral vertebrals as well as normal flow dynamics in the bilateral subclavian arteries.  Past Medical History:  Diagnosis Date  . Anxiety   . Asthma   . Cancer ([place])    skin. on face  . Complication of anesthesia 1990   pt woke up in middle of bunionectomy.   Marland Kitchen COPD (chronic obstructive pulmonary disease) ([place])   . Depression   . Diabetes mellitus without complication ([place])   . Dyspnea   . Dysrhythmia 01/2018   LBBB  . GERD (gastroesophageal reflux disease)   . History of broken nose 1970  . Hypertension   . Restless leg syndrome     Past Surgical History:  Procedure Laterality Date  . BUNIONECTOMY Bilateral 1990  . CAROTID PTA/STENT INTERVENTION Right 03/27/2019   Procedure: CAROTID  PTA/STENT INTERVENTION;  Surgeon: Algernon Huxley, MD;  Location: [facility];  Service: Cardiovascular;  Laterality: Right;  . COLONOSCOPY  [id number]  . ENDARTERECTOMY Right 03/17/2018   Procedure: ENDARTERECTOMY CAROTID;  Surgeon: Algernon Huxley, MD;  Location: [facility];  Service: Vascular;  Laterality: Right;  . NOSE SURGERY     broken nose  . RADIAL HEAD ARTHROPLASTY Left 08/10/2017   Procedure: RADIAL HEAD ARTHROPLASTY;  Surgeon: Corky Mull, MD;  Location: [facility];  Service: Orthopedics;  Laterality: Left;  . TUBAL LIGATION  1977  . YAG LASER APPLICATION Bilateral [id number]    Social History   Socioeconomic History  . Marital status: Divorced    Spouse name: Not on file  . Number of children: 3  . Years of education: Not on file  . Highest education level: 12th grade  Occupational History  . Occupation: Glass blower/designer  Social Needs  . Financial resource strain: Not on file  . Food insecurity    Worry: Never true    Inability: Not on file  . Transportation needs    Medical: No    Non-medical: Not on file  Tobacco Use  . Smoking status: Former Smoker    Packs/day: 1.50    Years: 45.00    Pack years: 67.50    Types: Cigarettes    Quit date: 10/17/2016    Years since quitting: 2.5  . Smokeless tobacco: Never Used  Substance and  Sexual Activity  . Alcohol use: Yes    Frequency: Never    Comment: socially  . Drug use: No  . Sexual activity: Not on file  Lifestyle  . Physical activity    Days per week: 0 days    Minutes per session: Not on file  . Stress: Rather much  Relationships  . Social Herbalist on phone: Never    Gets together: Twice a week    Attends religious service: Never    Active member of club or organization: Yes    Attends meetings of clubs or organizations: 1 to 4 times per year    Relationship status: Divorced  . Intimate partner violence    Fear of current or ex partner: No    Emotionally abused: No    Physically abused: No     Forced sexual activity: No  Other Topics Concern  . Not on file  Social History Narrative  . Not on file    Family History  Problem Relation Age of Onset  . Breast cancer Neg Hx     Allergies  Allergen Reactions  . Metformin Diarrhea     Review of Systems   Review of Systems: Negative Unless Checked Constitutional: [] Weight loss  [] Fever  [] Chills Cardiac: [] Chest pain   []  Atrial Fibrillation  [] Palpitations   [] Shortness of breath when laying flat   [] Shortness of breath with exertion. [] Shortness of breath at rest Vascular:  [] Pain in legs with walking   [] Pain in legs with standing [] Pain in legs when laying flat   [] Claudication    [] Pain in feet when laying flat    [] History of DVT   [] Phlebitis   [] Swelling in legs   [] Varicose veins   [] Non-healing ulcers Pulmonary:   [] Uses home oxygen   [] Productive cough   [] Hemoptysis   [] Wheeze  [x] COPD   [] Asthma Neurologic:  [] Dizziness   [] Seizures  [] Blackouts [] History of stroke   [] History of TIA  [] Aphasia   [] Temporary Blindness   [] Weakness or numbness in arm   [] Weakness or numbness in leg Musculoskeletal:   [] Joint swelling   [] Joint pain   [] Low back pain  []  History of Knee Replacement [] Arthritis [] back Surgeries  []  Spinal Stenosis    Hematologic:  [] Easy bruising  [] Easy bleeding   [] Hypercoagulable state   [] Anemic Gastrointestinal:  [] Diarrhea   [] Vomiting  [] Gastroesophageal reflux/heartburn   [] Difficulty swallowing. [] Abdominal pain Genitourinary:  [] Chronic kidney disease   [] Difficult urination  [] Anuric   [] Blood in urine [] Frequent urination  [] Burning with urination   [] Hematuria Skin:  [] Rashes   [] Ulcers [] Wounds Psychological:  [] History of anxiety   [x]  History of major depression  []  Memory Difficulties      OBJECTIVE:   Physical Exam  BP (!) 142/76 (BP Location: Left Arm, Patient Position: Sitting, Cuff Size: Normal)   Pulse 77   Resp 12   Ht 5\' 3"  (1.6 m)   Wt 196 lb (88.9 kg)   BMI 34.72 kg/m    Gen: WD/WN, NAD Head: [place]/AT, No temporalis wasting.  Ear/Nose/Throat: Hearing grossly intact, nares w/o erythema or drainage Eyes: PER, EOMI, sclera nonicteric.  Neck: Supple, no masses.  No JVD.  Pulmonary:  Good air movement, no use of accessory muscles.  Cardiac: RRR Vascular:  Vessel Right Left  Radial Palpable Palpable   Gastrointestinal: soft, non-distended. No guarding/no peritoneal signs.  Musculoskeletal: M/S 5/5 throughout.  No deformity or atrophy.  Neurologic: Pain and light  touch intact in extremities.  Symmetrical.  Speech is fluent. Motor exam as listed above. Psychiatric: Judgment intact, Mood & affect appropriate for pt's clinical situation. Dermatologic: Groin site well healed . No Ulcers Noted.  No changes consistent with cellulitis. Lymph : No Cervical lymphadenopathy, no lichenification or skin changes of chronic lymphedema.       ASSESSMENT AND PLAN:  1. Carotid stenosis, right Recommend:  Given the patient's asymptomatic subcritical stenosis no further invasive testing or surgery at this time.  Duplex ultrasound shows 1-39% stenosis of right and none on left. Patent right stent  Continue antiplatelet therapy as prescribed Continue management of CAD, HTN and Hyperlipidemia Healthy heart diet,  encouraged exercise at least 4 times per week Follow up in 3 months with duplex ultrasound and physical exam   2. Hyperlipidemia, unspecified hyperlipidemia type Continue statin as ordered and reviewed, no changes at this time   3. COPD, moderate ([place]) Continue pulmonary medications and aerosols as already ordered, these medications have been reviewed and there are no changes at this time.     Current Outpatient Medications on File Prior to Visit  Medication Sig Dispense Refill  . albuterol (PROVENTIL HFA) 108 (90 Base) MCG/ACT inhaler Inhale 2 puffs into the lungs every 6 (six) hours as needed (rescue inhaler).     . ALPRAZolam (XANAX) 0.25 MG tablet  Take 0.25 mg by mouth 2 (two) times daily as needed for anxiety.    . ASPIRIN LOW DOSE 81 MG EC tablet TAKE 1 TABLET BY MOUTH EVERY DAY 30 tablet 5  . atorvastatin (LIPITOR) 10 MG tablet Take 1 tablet (10 mg total) by mouth daily at 6 PM. 30 tablet 3  . b complex vitamins tablet Take 1 tablet by mouth daily.    . calcium-vitamin D (OSCAL 500/200 D-3) 500-200 MG-UNIT tablet Take 1 tablet by mouth.    . clopidogrel (PLAVIX) 75 MG tablet TAKE 1 TABLET BY MOUTH IN THE MORNING WITH BREAKFAST 30 tablet 5  . Fluticasone-Salmeterol (ADVAIR) 250-50 MCG/DOSE AEPB Inhale 1 puff into the lungs 2 (two) times daily.    Marland Kitchen glimepiride (AMARYL) 4 MG tablet Take 4 mg by mouth daily with breakfast.    . glucose blood test strip Use once daily Use as instructed.    . lidocaine-hydrocortisone (ANAMANTEL HC) 3-0.5 % CREA Apply topically. Place in outer ear as needed (for psoriasis)    . loratadine (CLARITIN) 10 MG tablet Take 10 mg by mouth daily.    . Multiple Vitamin (MULTI-VITAMIN DAILY PO) Take by mouth.    . naproxen sodium (ALEVE) 220 MG tablet Take 220 mg by mouth 2 (two) times daily as needed.    . Omega-3 Fatty Acids (FISH OIL) 1000 MG CAPS Take 1 capsule by mouth daily.    Marland Kitchen omeprazole (PRILOSEC) 20 MG capsule Take 20 mg by mouth daily.    . pioglitazone (ACTOS) 30 MG tablet Take 30 mg by mouth daily.    . pramipexole (MIRAPEX) 0.25 MG tablet Take 0.25 mg by mouth 2 (two) times daily. .    . pramipexole (MIRAPEX) 1 MG tablet Take 1 mg by mouth at bedtime.    . pyridoxine (B-6) 100 MG tablet Take 200 mg by mouth daily.     . sertraline (ZOLOFT) 100 MG tablet Take 50 mg by mouth daily. Takes half a tablet in the morning    . sitaGLIPtin (JANUVIA) 50 MG tablet Take 50 mg by mouth daily.    . Vit C-Cholecalciferol-Rose Hip (VITAMIN  C & D3/ROSE HIPS) [phone] MG-UNIT-MG CAPS Take 1 tablet by mouth daily.    . vitamin B-12 (CYANOCOBALAMIN) 1000 MCG tablet Take 1,000 mcg by mouth daily.     . Vitamin D,  Ergocalciferol, (DRISDOL) 50000 units CAPS capsule Take 50,000 Units by mouth every 7 (seven) days. On sunday    . vitamin E 400 UNIT capsule Take 400 Units by mouth daily.     No current facility-administered medications on file prior to visit.     There are no Patient Instructions on file for this visit. No follow-ups on file.   Kris Hartmann, NP  This note was completed with Sales executive.  Any errors are purely unintentional.

## 2019-07-04 DEATH — deceased

## 2019-07-25 ENCOUNTER — Other Ambulatory Visit (INDEPENDENT_AMBULATORY_CARE_PROVIDER_SITE_OTHER): Payer: Self-pay | Admitting: Nurse Practitioner

## 2019-07-25 DIAGNOSIS — Z95820 Peripheral vascular angioplasty status with implants and grafts: Secondary | ICD-10-CM

## 2019-07-25 DIAGNOSIS — I6523 Occlusion and stenosis of bilateral carotid arteries: Secondary | ICD-10-CM

## 2019-08-08 ENCOUNTER — Encounter (INDEPENDENT_AMBULATORY_CARE_PROVIDER_SITE_OTHER): Payer: Medicare Other

## 2019-08-08 ENCOUNTER — Ambulatory Visit (INDEPENDENT_AMBULATORY_CARE_PROVIDER_SITE_OTHER): Payer: Self-pay | Admitting: Vascular Surgery

## 2020-04-16 IMAGING — CT CT ANGIOGRAPHY HEAD
1 of 4 series · 6 of 14 positions shown · IV contrast (omnipaque)
Comparison: CT 01/07/2018

CLINICAL DATA: Follow-up carotid stenosis. Right carotid
endarterectomy March 2018. 80-99% stenosis shown on the right by
ultrasound.

EXAM:
CT ANGIOGRAPHY HEAD AND NECK
TECHNIQUE: Multidetector CT imaging of the head and neck was performed using
the standard protocol during bolus administration of intravenous
contrast. Multiplanar CT image reconstructions and MIPs were
obtained to evaluate the vascular anatomy. Carotid stenosis
measurements (when applicable) are obtained utilizing NASCET
criteria, using the distal internal carotid diameter as the
denominator.
CONTRAST:  100mL OMNIPAQUE IOHEXOL 350 MG/ML SOLN

[Series 12: ax thin · axial · 0.39mm/px · z∈[-455,-215]mm · 6 of 336 slices shown]
[im 48/336  soft-tissue]
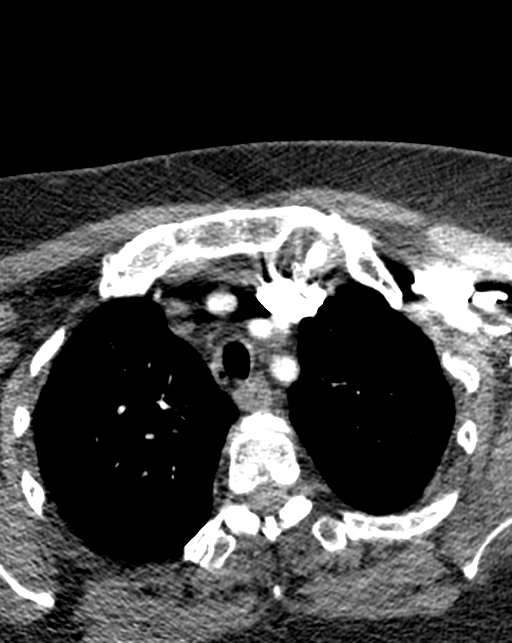
[im 96/336  bone]
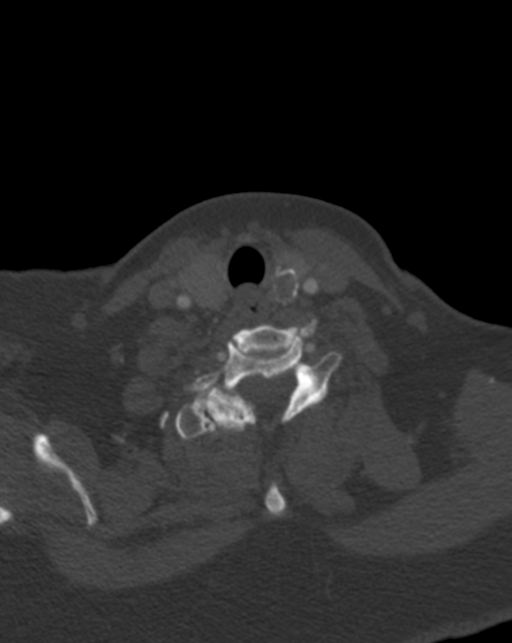
[im 144/336  soft-tissue]
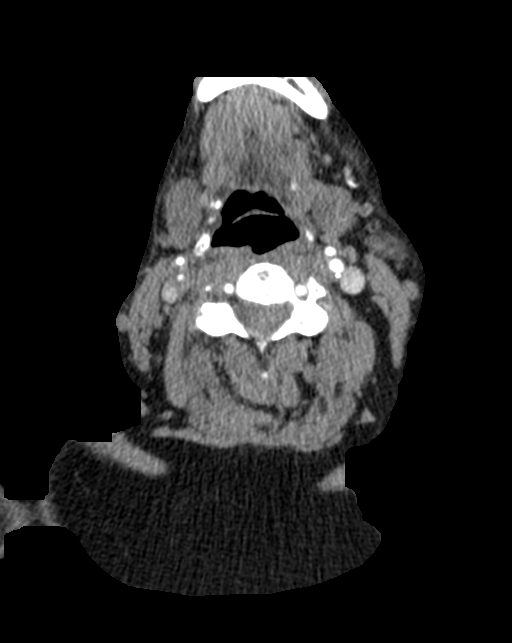
[im 192/336  bone]
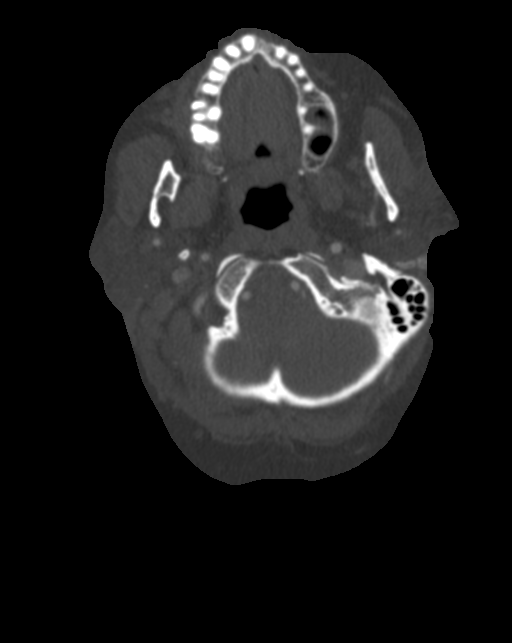
[im 240/336  soft-tissue]
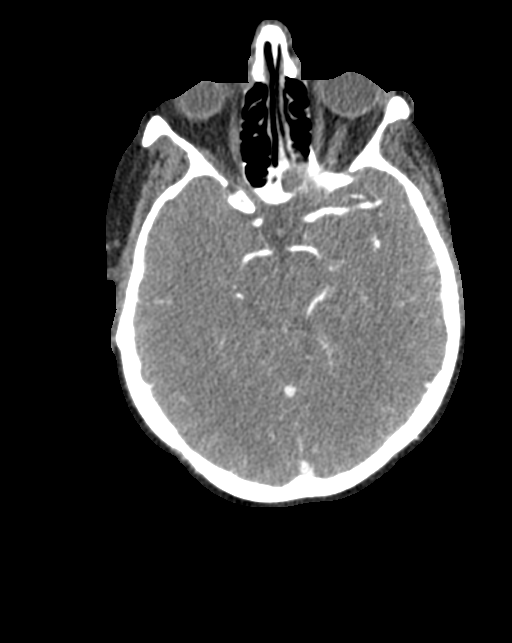
[im 288/336  bone]
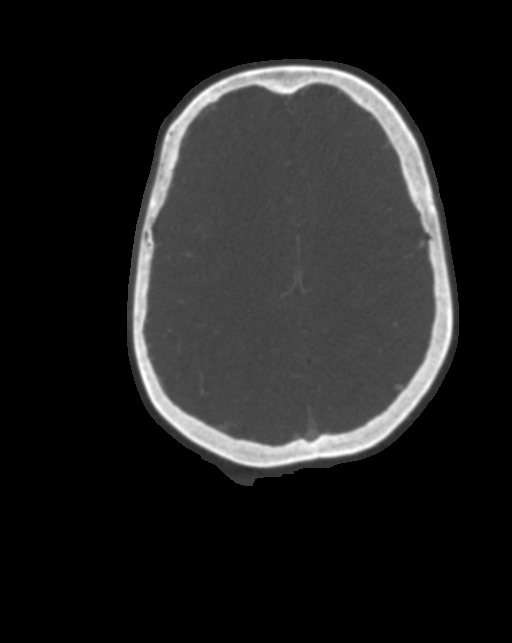

[6 of 14 positions shown; findings below may reference images not displayed]

FINDINGS: CT HEAD FINDINGS

Brain: The brain shows a normal appearance without evidence of
malformation, atrophy, old or acute small or large vessel
infarction, mass lesion, hemorrhage, hydrocephalus or extra-axial
collection.

Vascular: No hyperdense vessel. No evidence of atherosclerotic
calcification.

Skull: Normal.  No traumatic finding.  No focal bone lesion.

Sinuses/Orbits: Sinuses are clear. Orbits appear normal. Mastoids
are clear.

Other: None significant

CTA NECK FINDINGS

Aortic arch: Aortic atherosclerosis. No aneurysm or dissection.
Branching pattern is normal without origin stenosis.

Right carotid system: Common carotid artery shows soft plaque in the
distal 2 cm with severe stenosis just proximal to the bifurcation,
lumen measuring 1 mm. Proximal external carotid artery show stenosis
of about 30%. Proximal ICA shows a diameter of 1.5 mm. At the distal
bulb, there is a second stenosis with luminal diameter of 1 mm.
These findings are consistent with serial flow limiting stenoses.
More distal cervical ICA shows a small diameter because of flow
reduction. These stenoses are difficult to measure in the setting
flow reduction of the distal vessels, but they are consistent with
stenoses 80% or greater.

Left carotid system: Common carotid artery shows some plaque but no
stenosis. At the bifurcation and ICA bulb, there is calcified
plaque. Minimal diameter the proximal ICA is 2 mm. Compared to a
more distal cervical ICA diameter of 4 mm, this indicates a 50%
stenosis.

Vertebral arteries: There is calcified plaque at the right vertebral
artery origin but no stenosis. Left vertebral artery origin is
widely patent. The proximal vessel is tortuous but widely patent.
Both vertebral arteries are widely patent through the cervical
region without stenosis.

Skeleton: Mild cervical spondylosis.

Other neck: Benign appearing calcified thyroid nodule on the left.
No regional adenopathy.

Upper chest: Normal

Review of the MIP images confirms the above findings

CTA HEAD FINDINGS

Anterior circulation: Both internal carotid arteries are patent
through the skull base and siphon regions. There is siphon
atherosclerotic calcification but no stenosis greater than 30%. The
anterior and middle cerebral vessels are patent without proximal
stenosis, aneurysm or vascular malformation.

Posterior circulation: Both vertebral arteries are patent through
the foramen magnum to the basilar. No basilar stenosis. Posterior
circulation branch vessels appear patent and normal.

Venous sinuses: Patent and normal.

Anatomic variants: None significant.

Review of the MIP images confirms the above findings
IMPRESSION: High-grade serial stenoses of the right carotid system. The distal
common carotid artery just proximal to the bifurcation is narrowed
due to soft plaque with a diameter of 1 mm. Stenosis at the ICA
origin measures 1.5 mm. Stenosis at the distal bulb measures 1 mm.
All of the stenoses are consistent with 80% or greater lesions.
Reduction of the diameter of the more distal cervical ICA because of
flow reduction.

Atherosclerotic plaque at the ICA bulb on the left with stenosis of
50% at the origin.

No significant posterior circulation finding.

No intracranial stenosis or occlusion.
